# Patient Record
Sex: Male | Born: 1950 | Race: White | Hispanic: No | Marital: Married | Smoking: Never smoker
Health system: Southern US, Community
[De-identification: ages and names within clinical notes are randomized; demographics above are authoritative.]

## PROBLEM LIST (undated history)

## (undated) DIAGNOSIS — H5440 Blindness, one eye, unspecified eye: Secondary | ICD-10-CM

## (undated) DIAGNOSIS — Z87442 Personal history of urinary calculi: Secondary | ICD-10-CM

## (undated) DIAGNOSIS — H409 Unspecified glaucoma: Secondary | ICD-10-CM

## (undated) DIAGNOSIS — C801 Malignant (primary) neoplasm, unspecified: Secondary | ICD-10-CM

## (undated) DIAGNOSIS — R202 Paresthesia of skin: Secondary | ICD-10-CM

## (undated) DIAGNOSIS — I10 Essential (primary) hypertension: Secondary | ICD-10-CM

## (undated) DIAGNOSIS — Z8709 Personal history of other diseases of the respiratory system: Secondary | ICD-10-CM

## (undated) DIAGNOSIS — J40 Bronchitis, not specified as acute or chronic: Secondary | ICD-10-CM

## (undated) DIAGNOSIS — R200 Anesthesia of skin: Secondary | ICD-10-CM

## (undated) DIAGNOSIS — T8859XA Other complications of anesthesia, initial encounter: Secondary | ICD-10-CM

## (undated) HISTORY — PX: EYE SURGERY: SHX253

## (undated) HISTORY — PX: ANKLE SURGERY: SHX546

## (undated) HISTORY — DX: Malignant (primary) neoplasm, unspecified: C80.1

## (undated) HISTORY — PX: COLONOSCOPY: SHX174

---

## 2007-12-26 ENCOUNTER — Ambulatory Visit: Payer: Self-pay | Admitting: General Surgery

## 2009-04-17 ENCOUNTER — Ambulatory Visit: Payer: Self-pay | Admitting: Podiatry

## 2009-04-18 ENCOUNTER — Ambulatory Visit: Payer: Self-pay | Admitting: Podiatry

## 2009-04-21 ENCOUNTER — Emergency Department: Payer: Self-pay | Admitting: Emergency Medicine

## 2009-09-24 ENCOUNTER — Ambulatory Visit: Payer: Self-pay | Admitting: Podiatry

## 2010-01-12 ENCOUNTER — Ambulatory Visit: Payer: Self-pay | Admitting: Ophthalmology

## 2015-12-09 ENCOUNTER — Other Ambulatory Visit: Payer: Self-pay | Admitting: Student

## 2015-12-09 DIAGNOSIS — M7582 Other shoulder lesions, left shoulder: Secondary | ICD-10-CM

## 2015-12-26 ENCOUNTER — Ambulatory Visit
Admission: RE | Admit: 2015-12-26 | Discharge: 2015-12-26 | Disposition: A | Discharge status: Home / Self Care | Payer: Federal, State, Local not specified - PPO | Source: Ambulatory Visit | Attending: Student | Admitting: Student

## 2015-12-26 DIAGNOSIS — X58XXXA Exposure to other specified factors, initial encounter: Secondary | ICD-10-CM | POA: Diagnosis not present

## 2015-12-26 DIAGNOSIS — S43432A Superior glenoid labrum lesion of left shoulder, initial encounter: Secondary | ICD-10-CM | POA: Insufficient documentation

## 2015-12-26 DIAGNOSIS — M7582 Other shoulder lesions, left shoulder: Secondary | ICD-10-CM | POA: Diagnosis present

## 2015-12-31 DIAGNOSIS — M7582 Other shoulder lesions, left shoulder: Secondary | ICD-10-CM | POA: Insufficient documentation

## 2015-12-31 DIAGNOSIS — S43432A Superior glenoid labrum lesion of left shoulder, initial encounter: Secondary | ICD-10-CM | POA: Insufficient documentation

## 2016-01-20 ENCOUNTER — Other Ambulatory Visit: Payer: Self-pay | Admitting: Orthopaedic Surgery

## 2016-01-20 DIAGNOSIS — M4722 Other spondylosis with radiculopathy, cervical region: Secondary | ICD-10-CM

## 2016-01-31 ENCOUNTER — Ambulatory Visit (HOSPITAL_COMMUNITY)
Admission: RE | Admit: 2016-01-31 | Discharge: 2016-01-31 | Disposition: A | Discharge status: Home / Self Care | Payer: Federal, State, Local not specified - PPO | Source: Ambulatory Visit | Attending: Orthopaedic Surgery | Admitting: Orthopaedic Surgery

## 2016-01-31 DIAGNOSIS — M50121 Cervical disc disorder at C4-C5 level with radiculopathy: Secondary | ICD-10-CM | POA: Insufficient documentation

## 2016-01-31 DIAGNOSIS — M50122 Cervical disc disorder at C5-C6 level with radiculopathy: Secondary | ICD-10-CM | POA: Diagnosis not present

## 2016-01-31 DIAGNOSIS — M50123 Cervical disc disorder at C6-C7 level with radiculopathy: Secondary | ICD-10-CM | POA: Insufficient documentation

## 2016-01-31 DIAGNOSIS — M5021 Other cervical disc displacement,  high cervical region: Secondary | ICD-10-CM | POA: Insufficient documentation

## 2016-01-31 DIAGNOSIS — M4802 Spinal stenosis, cervical region: Secondary | ICD-10-CM | POA: Diagnosis not present

## 2016-01-31 DIAGNOSIS — M50221 Other cervical disc displacement at C4-C5 level: Secondary | ICD-10-CM | POA: Diagnosis not present

## 2016-01-31 DIAGNOSIS — M4722 Other spondylosis with radiculopathy, cervical region: Secondary | ICD-10-CM | POA: Insufficient documentation

## 2016-02-11 ENCOUNTER — Ambulatory Visit: Payer: Federal, State, Local not specified - PPO

## 2016-02-18 ENCOUNTER — Other Ambulatory Visit: Payer: Self-pay | Admitting: Orthopedic Surgery

## 2016-03-08 NOTE — Pre-Procedure Instructions (Signed)
Manuel Patel  03/08/2016      [facility] [address] [place], [place] - [address] [facility] [address] [facility] [address] [facility] [address] [place] [address] Phone: [phone] Fax: [phone]    Your procedure is scheduled on Wednesday, August 9th, 2017.  Report to [facility] Admitting at 11:30 A.M.   Call this number if you have problems the morning of surgery:  [phone]   Remember:  Do not eat food or drink liquids after midnight.   Take these medicines the morning of surgery with A SIP OF WATER: Atenolol (Tenormin).  7 days prior to surgery, stop taking: Aspirin, NSAIDS, Aleve, Naproxen, Ibuprofen, Advil, Motrin, BC's, Goody's, Fish oil, all herbal medications, and all vitamins.    Do not wear jewelry.  Do not wear lotions, powders, or colognes.    Men may shave face and neck.  Do not bring valuables to the hospital.  [facility] is not responsible for any belongings or valuables.  Contacts, dentures or bridgework may not be worn into surgery.  Leave your suitcase in the car.  After surgery it may be brought to your room.  For patients admitted to the hospital, discharge time will be determined by your treatment team.  Patients discharged the day of surgery will not be allowed to drive home.   Special instructions:  Preparing for Surgery.   Please read over the following fact sheets that you were given. MRSA Information    [place]- Preparing For Surgery  Before surgery, you can play an important role. Because skin is not sterile, your skin needs to be as free of germs as possible. You can reduce the number of germs on your skin by washing with CHG (chlorahexidine gluconate) Soap before surgery.  CHG is an antiseptic cleaner which kills germs and bonds with the skin to continue killing germs even after washing.  Please do not use if you have an allergy to CHG or antibacterial soaps. If your skin becomes reddened/irritated stop using the CHG.  Do not  shave (including legs and underarms) for at least 48 hours prior to first CHG shower. It is OK to shave your face.  Please follow these instructions carefully.   1. Shower the NIGHT BEFORE SURGERY and the MORNING OF SURGERY with CHG.   2. If you chose to wash your hair, wash your hair first as usual with your normal shampoo.  3. After you shampoo, rinse your hair and body thoroughly to remove the shampoo.  4. Use CHG as you would any other liquid soap. You can apply CHG directly to the skin and wash gently with a scrungie or a clean washcloth.   5. Apply the CHG Soap to your body ONLY FROM THE NECK DOWN.  Do not use on open wounds or open sores. Avoid contact with your eyes, ears, mouth and genitals (private parts). Wash genitals (private parts) with your normal soap.  6. Wash thoroughly, paying special attention to the area where your surgery will be performed.  7. Thoroughly rinse your body with warm water from the neck down.  8. DO NOT shower/wash with your normal soap after using and rinsing off the CHG Soap.  9. Pat yourself dry with a CLEAN TOWEL.   10. Wear CLEAN PAJAMAS   11. Place CLEAN SHEETS on your bed the night of your first shower and DO NOT SLEEP WITH PETS.  Day of Surgery: Do not apply any deodorants/lotions. Please wear clean clothes to  the hospital/surgery center.

## 2016-03-09 ENCOUNTER — Encounter (HOSPITAL_COMMUNITY): Payer: Self-pay

## 2016-03-09 ENCOUNTER — Encounter (HOSPITAL_COMMUNITY)
Admission: RE | Admit: 2016-03-09 | Discharge: 2016-03-09 | Disposition: A | Discharge status: Home / Self Care | Payer: Federal, State, Local not specified - PPO | Source: Ambulatory Visit | Attending: Orthopedic Surgery | Admitting: Orthopedic Surgery

## 2016-03-09 ENCOUNTER — Ambulatory Visit (HOSPITAL_COMMUNITY)
Admission: RE | Admit: 2016-03-09 | Discharge: 2016-03-09 | Disposition: A | Discharge status: Home / Self Care | Payer: Federal, State, Local not specified - PPO | Source: Ambulatory Visit | Attending: Orthopedic Surgery | Admitting: Orthopedic Surgery

## 2016-03-09 DIAGNOSIS — I878 Other specified disorders of veins: Secondary | ICD-10-CM | POA: Diagnosis not present

## 2016-03-09 DIAGNOSIS — Z01818 Encounter for other preprocedural examination: Secondary | ICD-10-CM | POA: Diagnosis present

## 2016-03-09 DIAGNOSIS — R001 Bradycardia, unspecified: Secondary | ICD-10-CM | POA: Insufficient documentation

## 2016-03-09 DIAGNOSIS — Z01810 Encounter for preprocedural cardiovascular examination: Secondary | ICD-10-CM | POA: Diagnosis not present

## 2016-03-09 DIAGNOSIS — I517 Cardiomegaly: Secondary | ICD-10-CM | POA: Insufficient documentation

## 2016-03-09 HISTORY — DX: Personal history of urinary calculi: Z87.442

## 2016-03-09 HISTORY — DX: Essential (primary) hypertension: I10

## 2016-03-09 HISTORY — DX: Anesthesia of skin: R20.0

## 2016-03-09 HISTORY — DX: Personal history of other diseases of the respiratory system: Z87.09

## 2016-03-09 HISTORY — DX: Blindness, one eye, unspecified eye: H54.40

## 2016-03-09 HISTORY — DX: Unspecified glaucoma: H40.9

## 2016-03-09 HISTORY — DX: Paresthesia of skin: R20.2

## 2016-03-09 LAB — CBC WITH DIFFERENTIAL/PLATELET
BASOS PCT: 0 %
Basophils Absolute: 0 10*3/uL (ref 0.0–0.1)
EOS ABS: 0.4 10*3/uL (ref 0.0–0.7)
Eosinophils Relative: 4 %
HCT: 44.8 % (ref 39.0–52.0)
Hemoglobin: 15.4 g/dL (ref 13.0–17.0)
Lymphocytes Relative: 33 %
Lymphs Abs: 2.9 10*3/uL (ref 0.7–4.0)
MCH: 29.9 pg (ref 26.0–34.0)
MCHC: 34.4 g/dL (ref 30.0–36.0)
MCV: 87 fL (ref 78.0–100.0)
MONOS PCT: 9 %
Monocytes Absolute: 0.8 10*3/uL (ref 0.1–1.0)
NEUTROS PCT: 54 %
Neutro Abs: 4.8 10*3/uL (ref 1.7–7.7)
PLATELETS: 249 10*3/uL (ref 150–400)
RBC: 5.15 MIL/uL (ref 4.22–5.81)
RDW: 13.5 % (ref 11.5–15.5)
WBC: 8.9 10*3/uL (ref 4.0–10.5)

## 2016-03-09 LAB — URINALYSIS, ROUTINE W REFLEX MICROSCOPIC
Bilirubin Urine: NEGATIVE
GLUCOSE, UA: NEGATIVE mg/dL
Hgb urine dipstick: NEGATIVE
Ketones, ur: NEGATIVE mg/dL
LEUKOCYTES UA: NEGATIVE
NITRITE: NEGATIVE
PROTEIN: NEGATIVE mg/dL
Specific Gravity, Urine: 1.013 (ref 1.005–1.030)
pH: 6.5 (ref 5.0–8.0)

## 2016-03-09 LAB — SURGICAL PCR SCREEN
MRSA, PCR: NEGATIVE
STAPHYLOCOCCUS AUREUS: NEGATIVE

## 2016-03-09 LAB — PROTIME-INR
INR: 1.03
Prothrombin Time: 13.6 seconds (ref 11.4–15.2)

## 2016-03-09 LAB — APTT: aPTT: 32 seconds (ref 24–36)

## 2016-03-09 LAB — COMPREHENSIVE METABOLIC PANEL
ALT: 22 U/L (ref 17–63)
AST: 30 U/L (ref 15–41)
Albumin: 4.3 g/dL (ref 3.5–5.0)
Alkaline Phosphatase: 88 U/L (ref 38–126)
Anion gap: 5 (ref 5–15)
BUN: 11 mg/dL (ref 6–20)
CHLORIDE: 108 mmol/L (ref 101–111)
CO2: 25 mmol/L (ref 22–32)
CREATININE: 0.87 mg/dL (ref 0.61–1.24)
Calcium: 9.5 mg/dL (ref 8.9–10.3)
GFR calc non Af Amer: >60 mL/min (ref >60)
Glucose, Bld: 93 mg/dL (ref 65–99)
POTASSIUM: 4.2 mmol/L (ref 3.5–5.1)
SODIUM: 138 mmol/L (ref 135–145)
Total Bilirubin: 1.2 mg/dL (ref 0.3–1.2)
Total Protein: 6.9 g/dL (ref 6.5–8.1)

## 2016-03-09 NOTE — Progress Notes (Signed)
PCP - Dr. Benita Stabile Cardiologist - denies  EKG - 03/09/16 CXR - 03/09/16  Echo/Stress Test/Cardiac Cath - denies  Patient denies chest pain and shortness of breath at PAT appointment.

## 2016-03-12 NOTE — H&P (Signed)
     PREOPERATIVE H&P  Chief Complaint: left shoulder weakness, left arm pain  HPI: Manuel Patel is a 65 y.o. male who presents with ongoing pain in the in the left arm and weakness in the left shoulder.  The patient has been having symptoms for 7 months at this point. The patient's EMG does reveal left C5 radiculopathy.  MRI reveals neuroforaminal stenosis on the left, spanning C3-C6. Spinal cord deformity is also noted at C3-C4 and C4-C5.  Myelomalacia is noted at C3-C4.  Patient has failed multiple forms of conservative care and continues to have pain (see office notes for additional details regarding the patient's full course of treatment)  Past Medical History:  Diagnosis Date  . Blind left eye   . Glaucoma, right eye   . History of bronchitis   . History of kidney stones   . Hypertension   . Numbness and tingling in left arm    Past Surgical History:  Procedure Laterality Date  . ANKLE SURGERY Right   . COLONOSCOPY    . EYE SURGERY Left    prosthetic left eye  . EYE SURGERY Right    cataract   Social History   Social History  . Marital status: Married    Spouse name: N/A  . Number of children: N/A  . Years of education: N/A   Social History Main Topics  . Smoking status: Never Smoker  . Smokeless tobacco: Never Used  . Alcohol use No  . Drug use: No  . Sexual activity: Not on file   Other Topics Concern  . Not on file   Social History Narrative  . No narrative on file   No family history on file. Allergies  Allergen Reactions  . Penicillin V Rash   Prior to Admission medications   Medication Sig Start Date End Date Taking? Authorizing Provider  amLODipine-valsartan (EXFORGE) 5-160 MG tablet Take 1 tablet by mouth daily. 01/29/16  Yes Historical Provider, MD  Ascorbic Acid (VITAMIN C) 100 MG tablet Take 100 mg by mouth daily.   Yes Historical Provider, MD  atenolol (TENORMIN) 50 MG tablet Take 50 mg by mouth 2 (two) times daily. 12/15/15  Yes Historical  Provider, MD  cholecalciferol (VITAMIN D) 1000 units tablet Take 1,000 Units by mouth daily.   Yes Historical Provider, MD  Multiple Vitamin (MULTIVITAMIN) tablet Take 1 tablet by mouth daily.   Yes Historical Provider, MD  timolol (TIMOPTIC-XR) 0.5 % ophthalmic gel-forming Place 1 drop into the right eye at bedtime. 01/12/16  Yes Historical Provider, MD     All other systems have been reviewed and were otherwise negative with the exception of those mentioned in the HPI and as above.  Physical Exam: There were no vitals filed for this visit.  General: Alert, no acute distress Cardiovascular: No pedal edema Respiratory: No cyanosis, no use of accessory musculature Skin: No lesions in the area of chief complaint Neurologic: Sensation intact distally Psychiatric: Patient is competent for consent with normal mood and affect Lymphatic: No axillary or cervical lymphadenopathy  MUSCULOSKELETAL: 1/5 left deltoid strength   Assessment/Plan: Left arm pain Plan for Procedure(s): ANTERIOR CERVICAL DECOMPRESSION FUSION CERVICAL 3-4, CERVICAL 4-5, CERVICAL 5-6 WITH INSTRUMENTATION AND ALLOGRAFT   Sinclair Ship, MD 03/12/2016 12:55 PM

## 2016-03-16 MED ORDER — CEFAZOLIN SODIUM-DEXTROSE 2-4 GM/100ML-% IV SOLN
2.0000 g | INTRAVENOUS | Status: DC
  Filled 2016-03-16: qty 100

## 2016-03-17 ENCOUNTER — Encounter (HOSPITAL_COMMUNITY)
Admission: RE | Disposition: A | Discharge status: Home / Self Care | Payer: Self-pay | Source: Ambulatory Visit | Attending: Orthopedic Surgery

## 2016-03-17 ENCOUNTER — Ambulatory Visit (HOSPITAL_COMMUNITY): Payer: Federal, State, Local not specified - PPO | Admitting: Certified Registered"

## 2016-03-17 ENCOUNTER — Ambulatory Visit (HOSPITAL_COMMUNITY): Payer: Federal, State, Local not specified - PPO

## 2016-03-17 ENCOUNTER — Encounter (HOSPITAL_COMMUNITY): Payer: Self-pay | Admitting: *Deleted

## 2016-03-17 ENCOUNTER — Observation Stay (HOSPITAL_COMMUNITY)
Admission: RE | Admit: 2016-03-17 | Discharge: 2016-03-18 | Disposition: A | Discharge status: Home / Self Care | Payer: Federal, State, Local not specified - PPO | Source: Ambulatory Visit | Attending: Orthopedic Surgery | Admitting: Orthopedic Surgery

## 2016-03-17 DIAGNOSIS — Z880 Allergy status to penicillin: Secondary | ICD-10-CM | POA: Insufficient documentation

## 2016-03-17 DIAGNOSIS — M5410 Radiculopathy, site unspecified: Secondary | ICD-10-CM | POA: Diagnosis present

## 2016-03-17 DIAGNOSIS — H409 Unspecified glaucoma: Secondary | ICD-10-CM | POA: Insufficient documentation

## 2016-03-17 DIAGNOSIS — H5442 Blindness, left eye, normal vision right eye: Secondary | ICD-10-CM | POA: Diagnosis not present

## 2016-03-17 DIAGNOSIS — M50122 Cervical disc disorder at C5-C6 level with radiculopathy: Secondary | ICD-10-CM | POA: Diagnosis present

## 2016-03-17 DIAGNOSIS — I10 Essential (primary) hypertension: Secondary | ICD-10-CM | POA: Insufficient documentation

## 2016-03-17 DIAGNOSIS — Z419 Encounter for procedure for purposes other than remedying health state, unspecified: Secondary | ICD-10-CM

## 2016-03-17 HISTORY — PX: ANTERIOR CERVICAL DECOMP/DISCECTOMY FUSION: SHX1161

## 2016-03-17 SURGERY — ANTERIOR CERVICAL DECOMPRESSION/DISCECTOMY FUSION 3 LEVELS
Anesthesia: General

## 2016-03-17 MED ORDER — PROPOFOL 10 MG/ML IV BOLUS
INTRAVENOUS | Status: AC
  Filled 2016-03-17: qty 20

## 2016-03-17 MED ORDER — IRBESARTAN 150 MG PO TABS
150.0000 mg | ORAL_TABLET | Freq: Every day | ORAL | Status: DC
  Filled 2016-03-17 (×2): qty 1

## 2016-03-17 MED ORDER — FENTANYL CITRATE (PF) 100 MCG/2ML IJ SOLN: 25.0000 ug | INTRAMUSCULAR | Status: DC | PRN

## 2016-03-17 MED ORDER — ACETAMINOPHEN 650 MG RE SUPP
650.0000 mg | RECTAL | Status: DC | PRN
Start: 2016-03-17 — End: 2016-03-18

## 2016-03-17 MED ORDER — DOCUSATE SODIUM 100 MG PO CAPS: 100.0000 mg | ORAL_CAPSULE | Freq: Two times a day (BID) | ORAL | Status: DC

## 2016-03-17 MED ORDER — SENNOSIDES-DOCUSATE SODIUM 8.6-50 MG PO TABS
1.0000 | ORAL_TABLET | Freq: Every evening | ORAL | Status: DC | PRN
Start: 2016-03-17 — End: 2016-03-18

## 2016-03-17 MED ORDER — HYDROMORPHONE HCL 1 MG/ML IJ SOLN
INTRAMUSCULAR | Status: AC
Start: 2016-03-17 — End: 2016-03-17
  Filled 2016-03-17: qty 1

## 2016-03-17 MED ORDER — SODIUM CHLORIDE 0.9 % IV SOLN: 250.0000 mL | INTRAVENOUS | Status: DC

## 2016-03-17 MED ORDER — SUGAMMADEX SODIUM 200 MG/2ML IV SOLN
INTRAVENOUS | Status: AC
  Filled 2016-03-17: qty 2

## 2016-03-17 MED ORDER — 0.9 % SODIUM CHLORIDE (POUR BTL) OPTIME
TOPICAL | Status: DC | PRN
  Administered 2016-03-17: 1000 mL

## 2016-03-17 MED ORDER — METOCLOPRAMIDE HCL 5 MG/ML IJ SOLN
INTRAMUSCULAR | Status: DC | PRN
  Administered 2016-03-17: 10 mg via INTRAVENOUS

## 2016-03-17 MED ORDER — ACETAMINOPHEN 10 MG/ML IV SOLN
1000.0000 mg | INTRAVENOUS | Status: AC
  Administered 2016-03-17: 1000 mg via INTRAVENOUS
  Filled 2016-03-17: qty 100

## 2016-03-17 MED ORDER — EPHEDRINE 5 MG/ML INJ
INTRAVENOUS | Status: AC
  Filled 2016-03-17: qty 10

## 2016-03-17 MED ORDER — AMLODIPINE BESYLATE 5 MG PO TABS
5.0000 mg | ORAL_TABLET | Freq: Every day | ORAL | Status: DC
  Filled 2016-03-17 (×2): qty 1

## 2016-03-17 MED ORDER — METOCLOPRAMIDE HCL 5 MG/ML IJ SOLN
INTRAMUSCULAR | Status: AC
  Filled 2016-03-17: qty 2

## 2016-03-17 MED ORDER — PROPOFOL 10 MG/ML IV BOLUS
INTRAVENOUS | Status: DC | PRN
  Administered 2016-03-17: 160 mg via INTRAVENOUS

## 2016-03-17 MED ORDER — VITAMIN C 100 MG PO TABS: 100.0000 mg | ORAL_TABLET | Freq: Every day | ORAL | Status: DC

## 2016-03-17 MED ORDER — ONDANSETRON HCL 4 MG/2ML IJ SOLN
INTRAMUSCULAR | Status: AC
  Filled 2016-03-17: qty 2

## 2016-03-17 MED ORDER — ONDANSETRON HCL 4 MG/2ML IJ SOLN
INTRAMUSCULAR | Status: DC | PRN
  Administered 2016-03-17: 4 mg via INTRAVENOUS

## 2016-03-17 MED ORDER — VANCOMYCIN HCL IN DEXTROSE 1-5 GM/200ML-% IV SOLN
1000.0000 mg | INTRAVENOUS | Status: AC
  Administered 2016-03-17: 1000 mg via INTRAVENOUS
  Filled 2016-03-17: qty 200

## 2016-03-17 MED ORDER — ONDANSETRON HCL 4 MG/2ML IJ SOLN: 4.0000 mg | INTRAMUSCULAR | Status: DC | PRN

## 2016-03-17 MED ORDER — DIAZEPAM 5 MG PO TABS
5.0000 mg | ORAL_TABLET | Freq: Four times a day (QID) | ORAL | Status: DC | PRN
Start: 2016-03-17 — End: 2016-03-18
  Administered 2016-03-17: 5 mg via ORAL
  Filled 2016-03-17: qty 1

## 2016-03-17 MED ORDER — MORPHINE SULFATE (PF) 2 MG/ML IV SOLN
1.0000 mg | INTRAVENOUS | Status: DC | PRN
  Administered 2016-03-17 (×2): 2 mg via INTRAVENOUS
  Filled 2016-03-17 (×2): qty 1

## 2016-03-17 MED ORDER — OXYCODONE-ACETAMINOPHEN 5-325 MG PO TABS
1.0000 | ORAL_TABLET | ORAL | Status: DC | PRN
  Filled 2016-03-17: qty 2

## 2016-03-17 MED ORDER — SUGAMMADEX SODIUM 200 MG/2ML IV SOLN
INTRAVENOUS | Status: DC | PRN
  Administered 2016-03-17: 192.4 mg via INTRAVENOUS

## 2016-03-17 MED ORDER — THROMBIN 20000 UNITS EX SOLR
CUTANEOUS | Status: AC
  Filled 2016-03-17: qty 20000

## 2016-03-17 MED ORDER — MENTHOL 3 MG MT LOZG: 1.0000 | LOZENGE | OROMUCOSAL | Status: DC | PRN

## 2016-03-17 MED ORDER — MIDAZOLAM HCL 5 MG/5ML IJ SOLN
INTRAMUSCULAR | Status: DC | PRN
  Administered 2016-03-17 (×2): 2 mg via INTRAVENOUS

## 2016-03-17 MED ORDER — BISACODYL 5 MG PO TBEC: 5.0000 mg | DELAYED_RELEASE_TABLET | Freq: Every day | ORAL | Status: DC | PRN

## 2016-03-17 MED ORDER — TIMOLOL MALEATE 0.5 % OP SOLG
1.0000 [drp] | Freq: Every day | OPHTHALMIC | Status: DC
  Administered 2016-03-17: 1 [drp] via OPHTHALMIC
  Filled 2016-03-17 (×9): qty 5

## 2016-03-17 MED ORDER — BUPIVACAINE-EPINEPHRINE 0.25% -1:200000 IJ SOLN
INTRAMUSCULAR | Status: DC | PRN
  Administered 2016-03-17: 5 mL

## 2016-03-17 MED ORDER — ROCURONIUM BROMIDE 10 MG/ML (PF) SYRINGE
PREFILLED_SYRINGE | INTRAVENOUS | Status: AC
  Filled 2016-03-17: qty 10

## 2016-03-17 MED ORDER — LACTATED RINGERS IV SOLN
INTRAVENOUS | Status: DC
  Administered 2016-03-17 (×3): via INTRAVENOUS

## 2016-03-17 MED ORDER — ROCURONIUM BROMIDE 100 MG/10ML IV SOLN
INTRAVENOUS | Status: DC | PRN
  Administered 2016-03-17: 60 mg via INTRAVENOUS
  Administered 2016-03-17 (×2): 10 mg via INTRAVENOUS

## 2016-03-17 MED ORDER — MIDAZOLAM HCL 2 MG/2ML IJ SOLN
INTRAMUSCULAR | Status: AC
  Filled 2016-03-17: qty 2

## 2016-03-17 MED ORDER — ADULT MULTIVITAMIN W/MINERALS CH
1.0000 | ORAL_TABLET | Freq: Every day | ORAL | Status: DC
Start: 2016-03-17 — End: 2016-03-18

## 2016-03-17 MED ORDER — EPHEDRINE SULFATE 50 MG/ML IJ SOLN
INTRAMUSCULAR | Status: DC | PRN
  Administered 2016-03-17 (×2): 10 mg via INTRAVENOUS
  Administered 2016-03-17: 5 mg via INTRAVENOUS
  Administered 2016-03-17 (×4): 10 mg via INTRAVENOUS

## 2016-03-17 MED ORDER — THROMBIN 20000 UNITS EX KIT
PACK | CUTANEOUS | Status: DC | PRN
  Administered 2016-03-17: 20000 [IU] via TOPICAL

## 2016-03-17 MED ORDER — AMLODIPINE BESYLATE-VALSARTAN 5-160 MG PO TABS: 1.0000 | ORAL_TABLET | Freq: Every day | ORAL | Status: DC

## 2016-03-17 MED ORDER — BUPIVACAINE-EPINEPHRINE (PF) 0.25% -1:200000 IJ SOLN
INTRAMUSCULAR | Status: AC
  Filled 2016-03-17: qty 30

## 2016-03-17 MED ORDER — ALUM & MAG HYDROXIDE-SIMETH 200-200-20 MG/5ML PO SUSP: 30.0000 mL | Freq: Four times a day (QID) | ORAL | Status: DC | PRN

## 2016-03-17 MED ORDER — HYDROMORPHONE HCL 1 MG/ML IJ SOLN
INTRAMUSCULAR | Status: DC | PRN
  Administered 2016-03-17: .25 mg via INTRAVENOUS

## 2016-03-17 MED ORDER — DIPHENHYDRAMINE HCL 50 MG/ML IJ SOLN
INTRAMUSCULAR | Status: AC
  Filled 2016-03-17: qty 1

## 2016-03-17 MED ORDER — FLEET ENEMA 7-19 GM/118ML RE ENEM: 1.0000 | ENEMA | Freq: Once | RECTAL | Status: DC | PRN

## 2016-03-17 MED ORDER — GLYCOPYRROLATE 0.2 MG/ML IV SOSY
PREFILLED_SYRINGE | INTRAVENOUS | Status: AC
  Filled 2016-03-17: qty 3

## 2016-03-17 MED ORDER — PHENOL 1.4 % MT LIQD
1.0000 | OROMUCOSAL | Status: DC | PRN
Start: 2016-03-17 — End: 2016-03-18
  Administered 2016-03-17: 1 via OROMUCOSAL
  Filled 2016-03-17: qty 177

## 2016-03-17 MED ORDER — LIDOCAINE 2% (20 MG/ML) 5 ML SYRINGE
INTRAMUSCULAR | Status: AC
  Filled 2016-03-17: qty 5

## 2016-03-17 MED ORDER — SODIUM CHLORIDE 0.9% FLUSH: 3.0000 mL | INTRAVENOUS | Status: DC | PRN

## 2016-03-17 MED ORDER — ZOLPIDEM TARTRATE 5 MG PO TABS: 5.0000 mg | ORAL_TABLET | Freq: Every evening | ORAL | Status: DC | PRN

## 2016-03-17 MED ORDER — ACETAMINOPHEN 325 MG PO TABS: 650.0000 mg | ORAL_TABLET | ORAL | Status: DC | PRN

## 2016-03-17 MED ORDER — FENTANYL CITRATE (PF) 100 MCG/2ML IJ SOLN
INTRAMUSCULAR | Status: DC | PRN
Start: 2016-03-17 — End: 2016-03-17
  Administered 2016-03-17: 250 ug via INTRAVENOUS

## 2016-03-17 MED ORDER — DIPHENHYDRAMINE HCL 50 MG/ML IJ SOLN
INTRAMUSCULAR | Status: DC | PRN
  Administered 2016-03-17: 12.5 mg via INTRAVENOUS

## 2016-03-17 MED ORDER — VANCOMYCIN HCL IN DEXTROSE 1-5 GM/200ML-% IV SOLN
1000.0000 mg | Freq: Once | INTRAVENOUS | Status: AC
  Administered 2016-03-17: 1000 mg via INTRAVENOUS

## 2016-03-17 MED ORDER — GLYCOPYRROLATE 0.2 MG/ML IJ SOLN
INTRAMUSCULAR | Status: DC | PRN
  Administered 2016-03-17 (×2): 0.2 mg via INTRAVENOUS
  Administered 2016-03-17: .2 mg via INTRAVENOUS

## 2016-03-17 MED ORDER — ATENOLOL 50 MG PO TABS
50.0000 mg | ORAL_TABLET | Freq: Two times a day (BID) | ORAL | Status: DC
Start: 2016-03-17 — End: 2016-03-18
  Administered 2016-03-17: 50 mg via ORAL
  Filled 2016-03-17 (×2): qty 1

## 2016-03-17 MED ORDER — FENTANYL CITRATE (PF) 250 MCG/5ML IJ SOLN
INTRAMUSCULAR | Status: AC
  Filled 2016-03-17: qty 5

## 2016-03-17 MED ORDER — SODIUM CHLORIDE 0.9% FLUSH
3.0000 mL | Freq: Two times a day (BID) | INTRAVENOUS | Status: DC
  Administered 2016-03-17: 3 mL via INTRAVENOUS

## 2016-03-17 MED ORDER — VITAMIN D 1000 UNITS PO TABS: 1000.0000 [IU] | ORAL_TABLET | Freq: Every day | ORAL | Status: DC

## 2016-03-17 MED ORDER — TIMOLOL MALEATE 0.5 % OP SOLG
1.0000 [drp] | Freq: Every day | OPHTHALMIC | Status: DC
Start: 2016-03-17 — End: 2016-03-17

## 2016-03-17 MED ORDER — POVIDONE-IODINE 7.5 % EX SOLN
Freq: Once | CUTANEOUS | Status: DC
  Filled 2016-03-17: qty 118

## 2016-03-17 SURGICAL SUPPLY — 71 items
BENZOIN TINCTURE PRP APPL 2/3 (GAUZE/BANDAGES/DRESSINGS) ×2 IMPLANT
BIT DRILL NEURO 2X3.1 SFT TUCH (MISCELLANEOUS) ×1 IMPLANT
BIT DRILL SRG 14X2.2XFLT CHK (BIT) ×1 IMPLANT
BIT DRL SRG 14X2.2XFLT CHK (BIT) ×1
BLADE SURG 15 STRL LF DISP TIS (BLADE) ×1 IMPLANT
BLADE SURG 15 STRL SS (BLADE) ×1
BLADE SURG ROTATE 9660 (MISCELLANEOUS) ×2 IMPLANT
CANISTER SUCT 3000ML (MISCELLANEOUS) ×2 IMPLANT
CARTRIDGE OIL MAESTRO DRILL (MISCELLANEOUS) ×1 IMPLANT
COVER SURGICAL LIGHT HANDLE (MISCELLANEOUS) ×2 IMPLANT
CRADLE DONUT ADULT HEAD (MISCELLANEOUS) ×2 IMPLANT
DIFFUSER DRILL AIR PNEUMATIC (MISCELLANEOUS) ×2 IMPLANT
DRAIN JACKSON RD 7FR 3/32 (WOUND CARE) IMPLANT
DRAPE C-ARM 42X72 X-RAY (DRAPES) ×2 IMPLANT
DRAPE POUCH INSTRU U-SHP 10X18 (DRAPES) ×2 IMPLANT
DRAPE SURG 17X23 STRL (DRAPES) ×6 IMPLANT
DRILL BIT SKYLINE 14MM (BIT) ×1
DRILL NEURO 2X3.1 SOFT TOUCH (MISCELLANEOUS) ×2
DURAPREP 6ML APPLICATOR 50/CS (WOUND CARE) ×2 IMPLANT
ELECT COATED BLADE 2.86 ST (ELECTRODE) ×2 IMPLANT
ELECT REM PT RETURN 9FT ADLT (ELECTROSURGICAL) ×2
ELECTRODE REM PT RTRN 9FT ADLT (ELECTROSURGICAL) ×1 IMPLANT
EVACUATOR SILICONE 100CC (DRAIN) IMPLANT
GAUZE SPONGE 4X4 12PLY STRL (GAUZE/BANDAGES/DRESSINGS) ×2 IMPLANT
GAUZE SPONGE 4X4 16PLY XRAY LF (GAUZE/BANDAGES/DRESSINGS) ×2 IMPLANT
GLOVE BIO SURGEON STRL SZ7 (GLOVE) ×2 IMPLANT
GLOVE BIO SURGEON STRL SZ8 (GLOVE) ×2 IMPLANT
GLOVE BIOGEL PI IND STRL 7.0 (GLOVE) ×1 IMPLANT
GLOVE BIOGEL PI IND STRL 8 (GLOVE) ×1 IMPLANT
GLOVE BIOGEL PI INDICATOR 7.0 (GLOVE) ×1
GLOVE BIOGEL PI INDICATOR 8 (GLOVE) ×1
GOWN STRL REUS W/ TWL LRG LVL3 (GOWN DISPOSABLE) ×1 IMPLANT
GOWN STRL REUS W/ TWL XL LVL3 (GOWN DISPOSABLE) ×1 IMPLANT
GOWN STRL REUS W/TWL LRG LVL3 (GOWN DISPOSABLE) ×1
GOWN STRL REUS W/TWL XL LVL3 (GOWN DISPOSABLE) ×1
INTERLOCK LRDTC CRVCL VBR 7MM (Bone Implant) ×3 IMPLANT
IV CATH 14GX2 1/4 (CATHETERS) ×2 IMPLANT
KIT BASIN OR (CUSTOM PROCEDURE TRAY) ×2 IMPLANT
KIT ROOM TURNOVER OR (KITS) ×2 IMPLANT
LORDOTIC CERVICAL VBR 7MM SM (Bone Implant) ×6 IMPLANT
MANIFOLD NEPTUNE II (INSTRUMENTS) ×2 IMPLANT
NEEDLE 27GAX1X1/2 (NEEDLE) ×2 IMPLANT
NEEDLE SPNL 20GX3.5 QUINCKE YW (NEEDLE) ×2 IMPLANT
NS IRRIG 1000ML POUR BTL (IV SOLUTION) ×2 IMPLANT
OIL CARTRIDGE MAESTRO DRILL (MISCELLANEOUS) ×2
PACK ORTHO CERVICAL (CUSTOM PROCEDURE TRAY) ×2 IMPLANT
PAD ARMBOARD 7.5X6 YLW CONV (MISCELLANEOUS) ×2 IMPLANT
PATTIES SURGICAL .5 X.5 (GAUZE/BANDAGES/DRESSINGS) IMPLANT
PATTIES SURGICAL .5 X1 (DISPOSABLE) IMPLANT
PIN DISTRACTION 14 (PIN) ×4 IMPLANT
PLATE SKYLINE THREE LEVEL 51MM (Plate) ×2 IMPLANT
PUTTY BONE DBX 5CC MIX (Putty) ×2 IMPLANT
SCREW SKYLINE VAR OS 14MM (Screw) ×16 IMPLANT
SPONGE INTESTINAL PEANUT (DISPOSABLE) ×6 IMPLANT
SPONGE SURGIFOAM ABS GEL 100 (HEMOSTASIS) IMPLANT
SPONGE SURGIFOAM ABS GEL 100C (HEMOSTASIS) ×2 IMPLANT
STRIP CLOSURE SKIN 1/2X4 (GAUZE/BANDAGES/DRESSINGS) ×2 IMPLANT
SURGIFLO W/THROMBIN 8M KIT (HEMOSTASIS) IMPLANT
SUT MNCRL AB 4-0 PS2 18 (SUTURE) ×2 IMPLANT
SUT SILK 4 0 (SUTURE)
SUT SILK 4-0 18XBRD TIE 12 (SUTURE) IMPLANT
SUT VIC AB 2-0 CT2 18 VCP726D (SUTURE) ×2 IMPLANT
SYR BULB IRRIGATION 50ML (SYRINGE) ×2 IMPLANT
SYR CONTROL 10ML LL (SYRINGE) ×2 IMPLANT
TAPE CLOTH 4X10 WHT NS (GAUZE/BANDAGES/DRESSINGS) ×2 IMPLANT
TAPE UMBILICAL COTTON 1/8X30 (MISCELLANEOUS) ×2 IMPLANT
TOWEL OR 17X24 6PK STRL BLUE (TOWEL DISPOSABLE) ×2 IMPLANT
TOWEL OR 17X26 10 PK STRL BLUE (TOWEL DISPOSABLE) ×2 IMPLANT
TRAY FOLEY CATH 16FRSI W/METER (SET/KITS/TRAYS/PACK) ×2 IMPLANT
WATER STERILE IRR 1000ML POUR (IV SOLUTION) ×2 IMPLANT
YANKAUER SUCT BULB TIP NO VENT (SUCTIONS) ×2 IMPLANT

## 2016-03-17 NOTE — Anesthesia Procedure Notes (Addendum)
Procedure Name: Intubation Date/Time: 03/17/2016 12:21 PM Performed by: Huey Romans ANN Pre-anesthesia Checklist: Patient identified, Emergency Drugs available, Suction available and Patient being monitored Patient Re-evaluated:Patient Re-evaluated prior to inductionOxygen Delivery Method: Circle System Utilized Preoxygenation: Pre-oxygenation with 100% oxygen Intubation Type: IV induction Ventilation: Mask ventilation without difficulty Laryngoscope Size: Miller and 3 Grade View: Grade II Tube type: Oral Number of attempts: 1 Airway Equipment and Method: Stylet,  Oral airway and LTA kit utilized Placement Confirmation: ETT inserted through vocal cords under direct vision,  positive ETCO2 and breath sounds checked- equal and bilateral Secured at: 23 cm Tube secured with: Tape Dental Injury: Teeth and Oropharynx as per pre-operative assessment

## 2016-03-17 NOTE — Op Note (Signed)
Date of Surgery: 03/17/2016  PREOPERATIVE DIAGNOSES: 1. Left-sided cervical radiculopathy. 2. Left shoulder weakness 3. Neuroforaminal stenosis and SCC spanning C3-C6 4. DDD C3-C6  POSTOPERATIVE DIAGNOSES: 1. Left-sided cervical radiculopathy. 2. Left shoulder weakness 3. Neuroforaminal stenosis and SCC spanning C3-C6 4. DDD C3-C6  PROCEDURE: 1. Anterior cervical decompression and fusion C3-C6 2. Placement of anterior instrumentation, C3-C6 3. Insertion of interbody device x 3 (Titan intervertebral spacers). 4. Use of morselized allograft. 5. Intraoperative use of fluoroscopy.  SURGEON: Phylliss Bob, MD.  ASSISTANTLonn Georgia McKenzie PA-C.  ANESTHESIA: General endotracheal anesthesia.  COMPLICATIONS: None.  DISPOSITION: Stable.  ESTIMATED BLOOD LOSS: Minimal.  INDICATIONS FOR SURGERY: Briefly, Manuel Patel is a pleasant 66 year old male, who did present to me with a history of ongoing pain and weakness in the left arm. An MRI did reveal the findings noted above. In addition, myelomalacia was noted. Given the  patient's ongoing pain and weakness and the findings on his MRI and lack of improvement with  appropriate nonoperative measures, we did discuss proceeding with the procedure noted above. The patient was fully aware of the risks and limitations of the surgery, and did elect to proceed. He did understand that his shoulder weakness may not improve.  OPERATIVE DETAILS: On 03/17/2016, the patient was brought to surgery and general endotracheal anesthesia was administered. The patient was placed supine on the hospital bed. The patient's neck was gently extended. The patient's arms were secured to his sides. All bony prominences were padded. The neck was prepped and draped. A left-sided transverse incision was then made. The platysma was incised. A Alben Deeds approach was utilized. A self-retaining retractor was placed and the vertebral bodies to  be fused were subperiosteally exposed. Caspar pins were then placed into the C5 and C6 vertebral bodies and distraction was applied. I then proceeded with a thorough and complete C5/6 intervertebral diskectomy. I was very pleased with the decompression  that I was able to accomplish. An appropriate decompression was confirmed  using a nerve hook. The endplates were then prepared and the appropriate sized interbody spacer was packed with DBX mix and tamped into position in the usual fashion. The lower Caspar pin was removed and bone wax was then placed in its place. A new Caspar pin was placed into the C4 vertebral body and distraction was applied across the C4/5 intervertebral space. Once again, a thorough diskectomy was performed. There was no stenosis noted at the completion of the diskectomy, this was confirmed using a nerve hook bilaterally. The endplates were then prepared and the appropriate sized interbody spacer was packed with DBX mix and tamped into position in the usual fashion. The lower Caspar pin was removed and bone wax was placed in its place. A new Caspar pin was placed into the C3 vertebral body and again, distraction was applied across the C3/4 intervertebral space. Once again, a thorough and complete C3/4 intervertebral diskectomy and decompression was performed. The endplates were then prepared. The appropriate sized interbody spacer was then packed with DBX mix and tamped into position in the usual fashion. I was very pleased with the press-fit of the spacers. The Caspar pins were removed and bone wax was placed in their place. I then selected the appropriate sized anterior cervical plate, which was placed over the anterior spine. 14 mm variable angle screws were placed, 2 in each vertebral body from C3 to C6 for a total of 8 vertebral body screws. The screws were then locked to the plate using the Cam locking  mechanism. I was very pleased  with the purchase of each of the screws. I was very pleased with the final fluoroscopic images. The wound was then irrigated. All bleeding was controlled using bipolar electrocautery. The wound was then closed in layers. The platysma was closed using 2-0 Vicryl and the skin was closed using 3-0 Monocryl. Benzoin and Steri-Strips were then applied followed by sterile dressing. All instrument counts were correct at the termination of the procedure.  Of note, Manuel Patel was my assistant throughout surgery, and did aid in retraction, suctioning, and closure from start to finish.     Phylliss Bob, MD

## 2016-03-17 NOTE — Progress Notes (Signed)
Orthopedic Tech Progress Note Patient Details:  Manuel Patel 22-Nov-1950 [id number]  Ortho Devices Type of Ortho Device: [place] cervical collar Ortho Device/Splint Interventions: Criss Alvine 03/17/2016, 7:08 PM

## 2016-03-17 NOTE — Transfer of Care (Signed)
Immediate Anesthesia Transfer of Care Note  Patient: Manuel Patel  Procedure(s) Performed: Procedure(s) with comments: ANTERIOR CERVICAL DECOMPRESSION FUSION CERVICAL 3-4, CERVICAL 4-5, CERVICAL 5-6 WITH INSTRUMENTATION AND ALLOGRAFT (N/A) - ANTERIOR CERVICAL DECOMPRESSION FUSION CERVICAL 3-4, CERVICAL 4-5, CERVICAL 5-6 WITH INSTRUMENTATION AND ALLOGRAFT  Patient Location: PACU  Anesthesia Type:General  Level of Consciousness: awake  Airway & Oxygen Therapy: Patient Spontanous Breathing and Patient connected to nasal cannula oxygen  Post-op Assessment: Report given to RN and Post -op Vital signs reviewed and stable  Post vital signs: Reviewed and stable  Last Vitals:  Vitals:   03/17/16 1115 03/17/16 1547  BP: (!) 159/92   Pulse: (!) 54   Resp: 20   Temp: 36.6 C (P) 36.5 C    Last Pain: There were no vitals filed for this visit.    Patients Stated Pain Goal: 2 ([id number])  Complications: No apparent anesthesia complications

## 2016-03-17 NOTE — Anesthesia Preprocedure Evaluation (Addendum)
Anesthesia Evaluation  Patient identified by MRN, date of birth, ID band Patient awake    Reviewed: Allergy & Precautions, H&P , Patient's Chart, lab work & pertinent test results, reviewed documented beta blocker date and time   Airway Mallampati: II  TM Distance: >3 FB Neck ROM: full   Comment: Thinned front teeth... Discussed ental injury Dental no notable dental hx.    Pulmonary    Pulmonary exam normal breath sounds clear to auscultation       Cardiovascular hypertension, On Home Beta Blockers and On Medications  Rhythm:regular Rate:Normal     Neuro/Psych    GI/Hepatic   Endo/Other    Renal/GU      Musculoskeletal   Abdominal   Peds  Hematology   Anesthesia Other Findings HTN; No CAD sx, NSR  Reproductive/Obstetrics                            Anesthesia Physical Anesthesia Plan  ASA: II  Anesthesia Plan: General   Post-op Pain Management:    Induction: Intravenous  Airway Management Planned: Oral ETT  Additional Equipment:   Intra-op Plan:   Post-operative Plan: Extubation in OR  Informed Consent: I have reviewed the patients History and Physical, chart, labs and discussed the procedure including the risks, benefits and alternatives for the proposed anesthesia with the patient or authorized representative who has indicated his/her understanding and acceptance.   Dental Advisory Given and Dental advisory given  Plan Discussed with: CRNA and Surgeon  Anesthesia Plan Comments: (  Discussed general anesthesia, including possible nausea, instrumentation of airway, sore throat,pulmonary aspiration, etc. I asked if the were any outstanding questions, or  concerns before we proceeded. )        Anesthesia Quick Evaluation

## 2016-03-17 NOTE — Progress Notes (Signed)
Pharmacy Antibiotic Note  Manuel Patel is a 65 y.o. male admitted on 03/17/2016 with surgical prophylaxis.  Pharmacy has been consulted for vancomycin dosing.  Plan: Vancomycin 1000mg  IV x1 dose 12 hours post pre-op dose.  No further doses needed as patient does NOT have drain in place.  Pharmacy will sign off - please re-consult if needed.   Weight: 212 lb (96.2 kg)  Temp (24hrs), Avg:97.7 F (36.5 C), Min:97.5 F (36.4 C), Max:97.9 F (36.6 C)  No results for input(s): WBC, CREATININE, LATICACIDVEN, VANCOTROUGH, VANCOPEAK, VANCORANDOM, GENTTROUGH, GENTPEAK, GENTRANDOM, TOBRATROUGH, TOBRAPEAK, TOBRARND, AMIKACINPEAK, AMIKACINTROU, AMIKACIN in the last 168 hours.  Estimated Creatinine Clearance: 101.2 mL/min (by C-G formula based on SCr of 0.87 mg/dL).    Allergies  Allergen Reactions  . Other     Pineapple-sick to stomach  . Penicillin V Rash   Thank you for allowing pharmacy to be a part of this patient's care.  Sloan Leiter, PharmD, BCPS Clinical Pharmacist [phone] 03/17/2016 7:01 PM

## 2016-03-18 ENCOUNTER — Encounter (HOSPITAL_COMMUNITY): Payer: Self-pay | Admitting: Orthopedic Surgery

## 2016-03-18 DIAGNOSIS — M50122 Cervical disc disorder at C5-C6 level with radiculopathy: Secondary | ICD-10-CM | POA: Diagnosis not present

## 2016-03-18 MED ORDER — OXYCODONE HCL 5 MG PO TABS
5.0000 mg | ORAL_TABLET | ORAL | Status: DC | PRN
  Administered 2016-03-18: 10 mg via ORAL
  Filled 2016-03-18: qty 2

## 2016-03-18 MED FILL — Thrombin For Soln 20000 Unit: CUTANEOUS | Qty: 1 | Status: AC

## 2016-03-18 NOTE — Progress Notes (Signed)
Pt doing well. Pt and wife given D/C instructions with Rx's, verbal understanding was provided. Pt's incision is clean and dry with no sign of infection. Pt's IV was removed prior to D/C. Pt D/C'd home via wheelchair @ [phone] per MD order. Pt is stable @ D/C and has no other needs at this time. Holli Humbles, RN

## 2016-03-18 NOTE — Anesthesia Postprocedure Evaluation (Signed)
Anesthesia Post Note  Patient: Manuel Patel  Procedure(s) Performed: Procedure(s) (LRB): ANTERIOR CERVICAL DECOMPRESSION FUSION CERVICAL 3-4, CERVICAL 4-5, CERVICAL 5-6 WITH INSTRUMENTATION AND ALLOGRAFT (N/A)  Patient location during evaluation: PACU Anesthesia Type: General Level of consciousness: sedated Pain management: satisfactory to patient Vital Signs Assessment: post-procedure vital signs reviewed and stable Respiratory status: spontaneous breathing Cardiovascular status: stable Anesthetic complications: no    Last Vitals:  Vitals:   03/18/16 0400 03/18/16 0738  BP: (!) 157/89 (!) 163/83  Pulse: (!) 59 (!) 55  Resp: 18 18  Temp: 36.8 C 36.8 C    Last Pain:  Vitals:   03/18/16 0815  TempSrc:   PainSc: [place]

## 2016-03-18 NOTE — Progress Notes (Signed)
    Patient doing well Patient denies R or L arm pain + dysphagia, as expected   Physical Exam: Vitals:   03/17/16 2338 03/18/16 0400  BP: (!) 152/82 (!) 157/89  Pulse: (!) 55 (!) 59  Resp: 20 18  Temp: 97.7 F (36.5 C) 98.2 F (36.8 C)    Dressing in place 1/5 left shoulder abduction (baseline)  POD #1 s/p C3-6 ACDF, doing well  - encourage ambulation - Percocet for pain, Valium for muscle spasms - dysphagia expected to improve, will monitor - likely d/c home today

## 2016-04-01 NOTE — Discharge Summary (Signed)
Patient ID: LARZ BISSETTE MRN: [id number] DOB/AGE: Feb 28, 1951 65 y.o.  Admit date: 03/17/2016 Discharge date: 03/18/2016  Admission Diagnoses:  Active Problems:   Radiculopathy   Discharge Diagnoses:  Same  Past Medical History:  Diagnosis Date  . Blind left eye   . Glaucoma, right eye   . History of bronchitis   . History of kidney stones   . Hypertension   . Numbness and tingling in left arm     Surgeries: Procedure(s): ANTERIOR CERVICAL DECOMPRESSION FUSION CERVICAL 3-4, CERVICAL 4-5, CERVICAL 5-6 WITH INSTRUMENTATION AND ALLOGRAFT on 03/17/2016   Consultants: None  Discharged Condition: Improved  Hospital Course: QUENTARIUS KADRI is an 65 y.o. male who was admitted 03/17/2016 for operative treatment of radiculopathy. Patient has severe unremitting pain that affects sleep, daily activities, and work/hobbies. After pre-op clearance the patient was taken to the operating room on 03/17/2016 and underwent  Procedure(s): ANTERIOR CERVICAL DECOMPRESSION FUSION CERVICAL 3-4, CERVICAL 4-5, CERVICAL 5-6 WITH INSTRUMENTATION AND ALLOGRAFT.    Patient was given perioperative antibiotics:  Anti-infectives    Start     Dose/Rate Route Frequency Ordered Stop   03/17/16 2330  vancomycin (VANCOCIN) IVPB 1000 mg/200 mL premix     1,000 mg 200 mL/hr over 60 Minutes Intravenous  Once 03/17/16 1903 03/18/16 0013   03/17/16 1400  ceFAZolin (ANCEF) IVPB 2g/100 mL premix  Status:  Discontinued     2 g 200 mL/hr over 30 Minutes Intravenous To ShortStay Surgical 03/16/16 1512 03/17/16 1817   03/17/16 1230  vancomycin (VANCOCIN) IVPB 1000 mg/200 mL premix     1,000 mg 200 mL/hr over 60 Minutes Intravenous To Surgery 03/17/16 1219 03/17/16 1335       Patient was given sequential compression devices, early ambulation to prevent DVT.  Patient benefited maximally from hospital stay and there were no complications.    Recent vital signs: BP (!) 163/83 (BP Location: Right Arm)   Pulse (!) 55   Temp 98.3  F (36.8 C) (Oral)   Resp 18   Wt 96.2 kg (212 lb)   SpO2 95%   BMI 30.42 kg/m    Discharge Medications:     Medication List    TAKE these medications   amLODipine-valsartan 5-160 MG tablet Commonly known as:  EXFORGE Take 1 tablet by mouth daily.   atenolol 50 MG tablet Commonly known as:  TENORMIN Take 50 mg by mouth 2 (two) times daily.   cholecalciferol 1000 units tablet Commonly known as:  VITAMIN D Take 1,000 Units by mouth daily.   multivitamin tablet Take 1 tablet by mouth daily.   timolol 0.5 % ophthalmic gel-forming Commonly known as:  TIMOPTIC-XR Place 1 drop into the right eye at bedtime.   vitamin C 100 MG tablet Take 100 mg by mouth daily.       Diagnostic Studies: Dg Chest 2 View  Result Date: 03/09/2016 CLINICAL DATA:  Preoperative examination prior to spinal surgery, history of bronchitis, hypertension, nonsmoker. EXAM: CHEST  2 VIEW COMPARISON:  None in PACs FINDINGS: The lungs are well-expanded. The interstitial markings are mildly prominent. The heart is top-normal in size. The pulmonary vascularity is prominent centrally. There is no interstitial or alveolar edema nor pleural effusion. The mediastinum is normal in width. There is multilevel degenerative disc disease of the thoracic spine. IMPRESSION: Mild central pulmonary vascular congestion and mild cardiomegaly consistent with low-grade CHF. No alveolar edema or pneumonia. Electronically Signed   By: David  Martinique M.D.   On:  03/09/2016 16:44   Dg Cervical Spine 1 View  Result Date: 03/17/2016 CLINICAL DATA:  C3-C6 anterior fusion EXAM: DG C-ARM 61-120 MIN; DG CERVICAL SPINE - 1 VIEW COMPARISON:  01/31/2016 FLUOROSCOPY TIME:  Radiation Exposure Index (as provided by the fluoroscopic device): Not available If the device does not provide the exposure index: Fluoroscopy Time:  Not available Number of Acquired Images:  1 FINDINGS: There are changes consistent with interbody fusion at C3-4, C4-5 and C5-6  with anterior fixation. No acute abnormality is noted. IMPRESSION: Cervical fusion as described. Electronically Signed   By: Inez Catalina M.D.   On: 03/17/2016 15:26   Dg C-arm 1-60 Min  Result Date: 03/17/2016 CLINICAL DATA:  C3-C6 anterior fusion EXAM: DG C-ARM 61-120 MIN; DG CERVICAL SPINE - 1 VIEW COMPARISON:  01/31/2016 FLUOROSCOPY TIME:  Radiation Exposure Index (as provided by the fluoroscopic device): Not available If the device does not provide the exposure index: Fluoroscopy Time:  Not available Number of Acquired Images:  1 FINDINGS: There are changes consistent with interbody fusion at C3-4, C4-5 and C5-6 with anterior fixation. No acute abnormality is noted. IMPRESSION: Cervical fusion as described. Electronically Signed   By: Inez Catalina M.D.   On: 03/17/2016 15:26    Disposition: 01-Home or Self Care   POD #1 s/p C3-6 ACDF, doing well  - encourage ambulation - Percocet for pain, Valium for muscle spasms - dysphagia expected to improve, will monitor -Written scripts for pain signed and in chart -D/C instructions sheet printed and in chart -D/C today  -F/U in office 2 weeks   Signed: Justice Britain 04/01/2016, 7:31 AM

## 2016-10-22 DIAGNOSIS — K219 Gastro-esophageal reflux disease without esophagitis: Secondary | ICD-10-CM | POA: Insufficient documentation

## 2016-10-22 DIAGNOSIS — R1314 Dysphagia, pharyngoesophageal phase: Secondary | ICD-10-CM | POA: Insufficient documentation

## 2016-11-05 DIAGNOSIS — R339 Retention of urine, unspecified: Secondary | ICD-10-CM | POA: Insufficient documentation

## 2016-11-05 DIAGNOSIS — N411 Chronic prostatitis: Secondary | ICD-10-CM | POA: Insufficient documentation

## 2017-08-09 DIAGNOSIS — C801 Malignant (primary) neoplasm, unspecified: Secondary | ICD-10-CM

## 2017-08-09 HISTORY — DX: Malignant (primary) neoplasm, unspecified: C80.1

## 2017-08-18 DIAGNOSIS — N320 Bladder-neck obstruction: Secondary | ICD-10-CM | POA: Insufficient documentation

## 2017-08-18 DIAGNOSIS — C61 Malignant neoplasm of prostate: Secondary | ICD-10-CM | POA: Insufficient documentation

## 2018-01-10 ENCOUNTER — Encounter: Payer: Self-pay | Admitting: Urology

## 2018-01-10 ENCOUNTER — Ambulatory Visit (INDEPENDENT_AMBULATORY_CARE_PROVIDER_SITE_OTHER): Payer: Federal, State, Local not specified - PPO | Admitting: Urology

## 2018-01-10 VITALS — BP 135/74 | HR 58 | Ht 67.9 in | Wt 221.7 lb

## 2018-01-10 DIAGNOSIS — C61 Malignant neoplasm of prostate: Secondary | ICD-10-CM | POA: Diagnosis not present

## 2018-01-10 NOTE — Progress Notes (Signed)
01/10/2018 1:44 PM   Larey Seat Feb 13, 1951 [id number]  Referring provider: Albina Billet, MD [address]   [place], [place] [address]  Chief Complaint  Patient presents with  . Prostate Cancer    HPI: 67 year old male presents for transfer of care for prostate cancer.  He has been seeing Dr. Jacqlyn Larsen at [facility] and underwent prostate biopsy and December 2018 for PSA of 4.2.  His prostate volume was 31 cc.  He had positive cores at the left mid and left apical region for Gleason 3+3 adenocarcinoma involving 10 and 30% of the submitted tissue respectively.  After discussing options he elected active surveillance.  He has no bothersome lower urinary tract symptoms.  Denies dysuria or gross hematuria.  Denies flank, abdominal, pelvic or scrotal pain.  He does have a prior history of stone disease.   PMH: Past Medical History:  Diagnosis Date  . Blind left eye   . Glaucoma, right eye   . History of bronchitis   . History of kidney stones   . Hypertension   . Numbness and tingling in left arm     Surgical History: Past Surgical History:  Procedure Laterality Date  . ANKLE SURGERY Right   . ANKLE SURGERY    . ANTERIOR CERVICAL DECOMP/DISCECTOMY FUSION N/A 03/17/2016   Procedure: ANTERIOR CERVICAL DECOMPRESSION FUSION CERVICAL 3-4, CERVICAL 4-5, CERVICAL 5-6 WITH INSTRUMENTATION AND ALLOGRAFT;  Surgeon: Phylliss Bob, MD;  Location: [facility];  Service: Orthopedics;  Laterality: N/A;  ANTERIOR CERVICAL DECOMPRESSION FUSION CERVICAL 3-4, CERVICAL 4-5, CERVICAL 5-6 WITH INSTRUMENTATION AND ALLOGRAFT  . COLONOSCOPY    . EYE SURGERY Left    prosthetic left eye  . EYE SURGERY Right    cataract    Home Medications:  Allergies as of 01/10/2018      Reactions   Other    Pineapple-sick to stomach   Penicillin V Rash   Pineapple Nausea Only      Medication List        Accurate as of 01/10/18  1:44 PM. Always use your most recent med list.          amLODipine 10 MG tablet Commonly  known as:  NORVASC   atenolol 50 MG tablet Commonly known as:  TENORMIN Take 50 mg by mouth 2 (two) times daily.   cholecalciferol 1000 units tablet Commonly known as:  VITAMIN D Take 1,000 Units by mouth daily.   multivitamin tablet Take 1 tablet by mouth daily.   tamsulosin 0.4 MG Caps capsule Commonly known as:  FLOMAX   timolol 0.5 % ophthalmic gel-forming Commonly known as:  TIMOPTIC-XR Place 1 drop into the right eye at bedtime.   vitamin C 100 MG tablet Take 100 mg by mouth daily.       Allergies:  Allergies  Allergen Reactions  . Other     Pineapple-sick to stomach  . Penicillin V Rash  . Pineapple Nausea Only    Family History: No family history on file.  Social History:  reports that he has never smoked. He has never used smokeless tobacco. He reports that he does not drink alcohol or use drugs.  ROS: UROLOGY Frequent Urination?: No Hard to postpone urination?: No Burning/pain with urination?: No Get up at night to urinate?: No Leakage of urine?: No Urine stream starts and stops?: No Trouble starting stream?: No Do you have to strain to urinate?: No Blood in urine?: No Urinary tract infection?: No Sexually transmitted disease?: No Injury  to kidneys or bladder?: No Painful intercourse?: No Weak stream?: No Erection problems?: No Penile pain?: No  Gastrointestinal Nausea?: No Vomiting?: No Indigestion/heartburn?: No Diarrhea?: No Constipation?: No  Constitutional Fever: No Night sweats?: No Weight loss?: No Fatigue?: No  Skin Skin rash/lesions?: No Itching?: No  Eyes Blurred vision?: No Double vision?: No  Ears/Nose/Throat Sore throat?: No Sinus problems?: No  Hematologic/Lymphatic Swollen glands?: No Easy bruising?: No  Cardiovascular Leg swelling?: No Chest pain?: No  Respiratory Cough?: No Shortness of breath?: No  Endocrine Excessive thirst?: No  Musculoskeletal Back pain?: Yes Joint pain?:  Yes  Neurological Headaches?: No Dizziness?: No  Psychologic Depression?: No Anxiety?: No  Physical Exam: BP 135/74 (BP Location: Right Arm, Patient Position: Sitting, Cuff Size: Large)   Pulse (!) 58   Ht 5' 7.9" (1.725 m)   Wt 221 lb 11.2 oz (100.6 kg)   SpO2 99%   BMI 33.81 kg/m   Constitutional:  Alert and oriented, No acute distress. HEENT: [place] AT, moist mucus membranes.  Trachea midline, no masses. Cardiovascular: No clubbing, cyanosis, or edema. Respiratory: Normal respiratory effort, no increased work of breathing. GI: Abdomen is soft, nontender, nondistended, no abdominal masses GU: No CVA tenderness.  Prostate 30 g, smooth without nodules. Lymph: No cervical or inguinal lymphadenopathy. Skin: No rashes, bruises or suspicious lesions. Neurologic: Grossly intact, no focal deficits, moving all 4 extremities. Psychiatric: Normal mood and affect.   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  DRE is benign.  PSA ordered today and is stable he will follow-up in 4 months.  He will be notified with results and further recommendations.   Return in about 4 years (around 01/10/2022) for Recheck, PSA.  Abbie Sons, [place] [address], [place], [place] [address] [phone]

## 2018-01-11 ENCOUNTER — Other Ambulatory Visit: Payer: Self-pay | Admitting: Urology

## 2018-01-11 ENCOUNTER — Telehealth: Payer: Self-pay | Admitting: Urology

## 2018-01-11 DIAGNOSIS — C61 Malignant neoplasm of prostate: Secondary | ICD-10-CM

## 2018-01-11 LAB — PSA: Prostate Specific Ag, Serum: 4.6 ng/mL — ABNORMAL HIGH (ref 0.0–4.0)

## 2018-01-11 NOTE — Telephone Encounter (Signed)
-----   Message from Abbie Sons, MD sent at 01/11/2018  7:59 AM EDT ----- PSA has increased to 4.6.  Recommend scheduling prostate MRI.

## 2018-01-11 NOTE — Telephone Encounter (Signed)
Spoke with patient and gave him his results and told him about the [facility]

## 2018-01-17 ENCOUNTER — Encounter: Payer: Self-pay | Admitting: *Deleted

## 2018-01-23 ENCOUNTER — Other Ambulatory Visit: Payer: Self-pay

## 2018-01-31 ENCOUNTER — Ambulatory Visit
Admission: RE | Admit: 2018-01-31 | Discharge: 2018-01-31 | Disposition: A | Discharge status: Home / Self Care | Payer: Federal, State, Local not specified - PPO | Source: Ambulatory Visit | Attending: Urology | Admitting: Urology

## 2018-01-31 DIAGNOSIS — C61 Malignant neoplasm of prostate: Secondary | ICD-10-CM

## 2018-01-31 MED ORDER — GADOBENATE DIMEGLUMINE 529 MG/ML IV SOLN
20.0000 mL | Freq: Once | INTRAVENOUS | Status: AC | PRN
  Administered 2018-01-31: 20 mL via INTRAVENOUS

## 2018-02-03 ENCOUNTER — Telehealth: Payer: Self-pay | Admitting: Urology

## 2018-02-03 NOTE — Telephone Encounter (Signed)
Pt called office stating he missed your call while in the shower, apologizes and asks for a return call about his results, Please advise. Thanks.

## 2018-02-06 ENCOUNTER — Telehealth: Payer: Self-pay | Admitting: Urology

## 2018-02-06 NOTE — Telephone Encounter (Signed)
Pt calling office asking about his results. States he called last week and hasn't heard anything. Please advise pt @ [phone]. Thanks.

## 2018-02-06 NOTE — Telephone Encounter (Signed)
Pt returning your call, states you called him about his prostate mri.

## 2018-02-07 NOTE — Telephone Encounter (Signed)
I contacted Manuel Patel regarding his MRI results.  He was informed of the PI-RADS 4 lesion which may be indicative of a high-grade prostate cancer and would recommend scheduling a fusion biopsy at [facility]  Please schedule fusion biopsy

## 2018-02-14 ENCOUNTER — Telehealth: Payer: Self-pay | Admitting: Urology

## 2018-02-14 DIAGNOSIS — C61 Malignant neoplasm of prostate: Secondary | ICD-10-CM

## 2018-02-14 NOTE — Telephone Encounter (Signed)
Please put a referral in for the fusion biopsy   Thanks, Sharyn Lull

## 2018-02-15 NOTE — Telephone Encounter (Signed)
Order was entered 

## 2018-02-21 ENCOUNTER — Encounter: Payer: Self-pay | Admitting: General Surgery

## 2018-02-21 ENCOUNTER — Ambulatory Visit: Payer: Federal, State, Local not specified - PPO | Admitting: General Surgery

## 2018-02-21 VITALS — BP 120/66 | HR 72 | Resp 14 | Ht 70.0 in | Wt 221.0 lb

## 2018-02-21 DIAGNOSIS — Z1211 Encounter for screening for malignant neoplasm of colon: Secondary | ICD-10-CM

## 2018-02-21 MED ORDER — POLYETHYLENE GLYCOL 3350 17 GM/SCOOP PO POWD: 1.0000 | Freq: Once | ORAL | 0 refills | Status: AC

## 2018-02-21 NOTE — Progress Notes (Signed)
Patient ID: Manuel Patel, male   DOB: March 06, 1951, 67 y.o.   MRN: [id number]  Chief Complaint  Patient presents with  . Colonoscopy    HPI Manuel Patel is a 67 y.o. male Here today for a evaluation of a screening colonoscopy.Last colonoscopy was in 2009. Patient states no GI problems at this time. Moves his bowels daily.  HPI  Past Medical History:  Diagnosis Date  . Blind left eye   . Cancer [facility]) 2019   prostate  . Glaucoma, right eye   . History of bronchitis   . History of kidney stones   . Hypertension   . Numbness and tingling in left arm     Past Surgical History:  Procedure Laterality Date  . ANKLE SURGERY Right   . ANKLE SURGERY    . ANTERIOR CERVICAL DECOMP/DISCECTOMY FUSION N/A 03/17/2016   Procedure: ANTERIOR CERVICAL DECOMPRESSION FUSION CERVICAL 3-4, CERVICAL 4-5, CERVICAL 5-6 WITH INSTRUMENTATION AND ALLOGRAFT;  Surgeon: Phylliss Bob, MD;  Location: [facility];  Service: Orthopedics;  Laterality: N/A;  ANTERIOR CERVICAL DECOMPRESSION FUSION CERVICAL 3-4, CERVICAL 4-5, CERVICAL 5-6 WITH INSTRUMENTATION AND ALLOGRAFT  . COLONOSCOPY    . EYE SURGERY Left    prosthetic left eye  . EYE SURGERY Right    cataract    No family history on file.  Social History Social History   Tobacco Use  . Smoking status: Never Smoker  . Smokeless tobacco: Never Used  Substance Use Topics  . Alcohol use: No  . Drug use: No    Allergies  Allergen Reactions  . Other     Pineapple-sick to stomach  . Penicillin V Rash  . Pineapple Nausea Only    Current Outpatient Medications  Medication Sig Dispense Refill  . amLODipine (NORVASC) 10 MG tablet     . atenolol (TENORMIN) 50 MG tablet Take 50 mg by mouth 2 (two) times daily.  0  . Multiple Vitamin (MULTIVITAMIN) tablet Take 1 tablet by mouth daily.    . tamsulosin (FLOMAX) 0.4 MG CAPS capsule     . timolol (TIMOPTIC-XR) 0.5 % ophthalmic gel-forming Place 1 drop into the right eye at bedtime.  0  . Ascorbic Acid (VITAMIN C) 100 MG  tablet Take 100 mg by mouth daily.    . cholecalciferol (VITAMIN D) 1000 units tablet Take 1,000 Units by mouth daily.     No current facility-administered medications for this visit.     Review of Systems Review of Systems  Constitutional: Negative.   Respiratory: Negative.   Cardiovascular: Negative.     Blood pressure 120/66, pulse 72, resp. rate 14, height _0  (1.778 m), weight 221 lb (100.2 kg).  Physical Exam Physical Exam  Constitutional: He is oriented to person, place, and time. He appears well-developed and well-nourished.  Cardiovascular: Normal rate, regular rhythm and normal heart sounds.  Pulmonary/Chest: Effort normal and breath sounds normal.  Neurological: He is alert and oriented to person, place, and time.  Skin: Skin is warm and dry.    Data Reviewed Laboratory studies dated February 16, 2017 showed a creatinine of 1.09 with an EGFR of 71.  Normal electrolytes.  Liver function studies.  Blood sugar 92.  Colonoscopy dated Dec 25, 2017 showed a single diverticulum in the sigmoid colon.  Assessment    Candidate for screening colonoscopy.    Plan  Colonoscopy with possible biopsy/polypectomy prn: Information regarding the procedure, including its potential risks and complications (including but not limited to perforation of the bowel,  which may require emergency surgery to repair, and bleeding) was verbally given to the patient. Educational information regarding lower intestinal endoscopy was given to the patient. Written instructions for how to complete the bowel prep using Miralax were provided. The importance of drinking ample fluids to avoid dehydration as a result of the prep emphasized.  HPI, Physical Exam, Assessment and Plan have been scribed under the direction and in the presence of Hervey Ard, MD. Gaspar Cola, CMA  I have completed the exam and reviewed the above documentation for accuracy and completeness.  I agree with the above.  Development worker, community has been used and any errors in dictation or transcription are unintentional.  Hervey Ard, M.D., F.A.C.S.  The patient is scheduled for a Colonoscopy at [facility] on 03/29/18. They are aware to call the day before to get their arrival time. He will only take his Atenolol the morning of at 6 am with a sip of water. Miralax prescription has been sent into the patient's pharmacy. The patient is aware of date and instructions.  Documented by Caryl-Lyn Derrich Brace LPN   Forest Gleason [place] 02/22/2018, 7:06 AM

## 2018-02-21 NOTE — Patient Instructions (Addendum)
Colonoscopy, Adult A colonoscopy is an exam to look at the entire large intestine. During the exam, a lubricated, bendable tube is inserted into the anus and then passed into the rectum, colon, and other parts of the large intestine. A colonoscopy is often done as a part of normal colorectal screening or in response to certain symptoms, such as anemia, persistent diarrhea, abdominal pain, and blood in the stool. The exam can help screen for and diagnose medical problems, including:  Tumors.  Polyps.  Inflammation.  Areas of bleeding.  Tell a health care provider about:  Any allergies you have.  All medicines you are taking, including vitamins, herbs, eye drops, creams, and over-the-counter medicines.  Any problems you or family members have had with anesthetic medicines.  Any blood disorders you have.  Any surgeries you have had.  Any medical conditions you have.  Any problems you have had passing stool. What are the risks? Generally, this is a safe procedure. However, problems may occur, including:  Bleeding.  A tear in the intestine.  A reaction to medicines given during the exam.  Infection (rare).  What happens before the procedure? Eating and drinking restrictions Follow instructions from your health care provider about eating and drinking, which may include:  A few days before the procedure - follow a low-fiber diet. Avoid nuts, seeds, dried fruit, raw fruits, and vegetables.  1-3 days before the procedure - follow a clear liquid diet. Drink only clear liquids, such as clear broth or bouillon, black coffee or tea, clear juice, clear soft drinks or sports drinks, gelatin dessert, and popsicles. Avoid any liquids that contain red or purple dye.  On the day of the procedure - do not eat or drink anything during the 2 hours before the procedure, or within the time period that your health care provider recommends.  Bowel prep If you were prescribed an oral bowel prep  to clean out your colon:  Take it as told by your health care provider. Starting the day before your procedure, you will need to drink a large amount of medicated liquid. The liquid will cause you to have multiple loose stools until your stool is almost clear or light green.  If your skin or anus gets irritated from diarrhea, you may use these to relieve the irritation: ? Medicated wipes, such as adult wet wipes with aloe and vitamin E. ? A skin soothing-product like petroleum jelly.  If you vomit while drinking the bowel prep, take a break for up to 60 minutes and then begin the bowel prep again. If vomiting continues and you cannot take the bowel prep without vomiting, call your health care provider.  General instructions  Ask your health care provider about changing or stopping your regular medicines. This is especially important if you are taking diabetes medicines or blood thinners.  Plan to have someone take you home from the hospital or clinic. What happens during the procedure?  An IV tube may be inserted into one of your veins.  You will be given medicine to help you relax (sedative).  To reduce your risk of infection: ? Your health care team will wash or sanitize their hands. ? Your anal area will be washed with soap.  You will be asked to lie on your side with your knees bent.  Your health care provider will lubricate a long, thin, flexible tube. The tube will have a camera and a light on the end.  The tube will be inserted into your   anus.  The tube will be gently eased through your rectum and colon.  Air will be delivered into your colon to keep it open. You may feel some pressure or cramping.  The camera will be used to take images during the procedure.  A small tissue sample may be removed from your body to be examined under a microscope (biopsy). If any potential problems are found, the tissue will be sent to a lab for testing.  If small polyps are found, your  health care provider may remove them and have them checked for cancer cells.  The tube that was inserted into your anus will be slowly removed. The procedure may vary among health care providers and hospitals. What happens after the procedure?  Your blood pressure, heart rate, breathing rate, and blood oxygen level will be monitored until the medicines you were given have worn off.  Do not drive for 24 hours after the exam.  You may have a small amount of blood in your stool.  You may pass gas and have mild abdominal cramping or bloating due to the air that was used to inflate your colon during the exam.  It is up to you to get the results of your procedure. Ask your health care provider, or the department performing the procedure, when your results will be ready. This information is not intended to replace advice given to you by your health care provider. Make sure you discuss any questions you have with your health care provider. Document Released: 07/23/2000 Document Revised: 05/26/2016 Document Reviewed: 10/07/2015 Elsevier Interactive Patient Education  [employer].  The patient is scheduled for a Colonoscopy at [facility] on 03/29/18. They are aware to call the day before to get their arrival time. He will only take his Atenolol the morning of at 6 am with a sip of water. Miralax prescription has been sent into the patient's pharmacy. The patient is aware of date and instructions.

## 2018-02-22 DIAGNOSIS — Z1211 Encounter for screening for malignant neoplasm of colon: Secondary | ICD-10-CM | POA: Insufficient documentation

## 2018-03-24 ENCOUNTER — Telehealth: Payer: Self-pay | Admitting: Urology

## 2018-03-24 NOTE — Telephone Encounter (Signed)
Patient had his Fusion biopsy today, do you want him to follow up for results or will you call him?  Sharyn Lull

## 2018-03-26 NOTE — Telephone Encounter (Signed)
I will call 

## 2018-03-28 ENCOUNTER — Other Ambulatory Visit: Payer: Self-pay | Admitting: Urology

## 2018-03-29 ENCOUNTER — Encounter
Admission: RE | Disposition: A | Discharge status: Home / Self Care | Payer: Self-pay | Source: Ambulatory Visit | Attending: General Surgery

## 2018-03-29 ENCOUNTER — Ambulatory Visit: Payer: Federal, State, Local not specified - PPO | Admitting: Certified Registered Nurse Anesthetist

## 2018-03-29 ENCOUNTER — Ambulatory Visit
Admission: RE | Admit: 2018-03-29 | Discharge: 2018-03-29 | Disposition: A | Discharge status: Home / Self Care | Payer: Federal, State, Local not specified - PPO | Source: Ambulatory Visit | Attending: General Surgery | Admitting: General Surgery

## 2018-03-29 DIAGNOSIS — I10 Essential (primary) hypertension: Secondary | ICD-10-CM | POA: Diagnosis not present

## 2018-03-29 DIAGNOSIS — Z1211 Encounter for screening for malignant neoplasm of colon: Secondary | ICD-10-CM

## 2018-03-29 DIAGNOSIS — K5730 Diverticulosis of large intestine without perforation or abscess without bleeding: Secondary | ICD-10-CM | POA: Diagnosis not present

## 2018-03-29 DIAGNOSIS — Z79899 Other long term (current) drug therapy: Secondary | ICD-10-CM | POA: Insufficient documentation

## 2018-03-29 HISTORY — PX: COLONOSCOPY WITH PROPOFOL: SHX5780

## 2018-03-29 SURGERY — COLONOSCOPY WITH PROPOFOL
Anesthesia: General

## 2018-03-29 MED ORDER — SODIUM CHLORIDE 0.9 % IV SOLN
INTRAVENOUS | Status: DC
  Administered 2018-03-29: 1000 mL via INTRAVENOUS

## 2018-03-29 MED ORDER — PROPOFOL 500 MG/50ML IV EMUL
INTRAVENOUS | Status: AC
  Filled 2018-03-29: qty 50

## 2018-03-29 MED ORDER — LIDOCAINE HCL (CARDIAC) PF 100 MG/5ML IV SOSY
PREFILLED_SYRINGE | INTRAVENOUS | Status: DC | PRN
  Administered 2018-03-29: 50 mg via INTRAVENOUS

## 2018-03-29 MED ORDER — PROPOFOL 10 MG/ML IV BOLUS
INTRAVENOUS | Status: DC | PRN
  Administered 2018-03-29: 30 mg via INTRAVENOUS
  Administered 2018-03-29: 70 mg via INTRAVENOUS

## 2018-03-29 MED ORDER — PROPOFOL 500 MG/50ML IV EMUL
INTRAVENOUS | Status: DC | PRN
  Administered 2018-03-29: 175 ug/kg/min via INTRAVENOUS

## 2018-03-29 NOTE — H&P (Signed)
Manuel Patel [id number] [birth year]     HPI:  67 yo male for screening colonoscopy. Underwent MRI guided prostate biopsy 5 days ago. Small amount of rectal/ urethral bleeding for < 48 hours. Presently, asymptomatic.   Tolerated prep well.    Medications Prior to Admission  Medication Sig Dispense Refill Last Dose  . amLODipine (NORVASC) 10 MG tablet    03/28/2018 at Unknown time  . atenolol (TENORMIN) 50 MG tablet Take 50 mg by mouth 2 (two) times daily.  0 03/29/2018 at Unknown time  . tamsulosin (FLOMAX) 0.4 MG CAPS capsule    03/28/2018 at Unknown time  . timolol (TIMOPTIC-XR) 0.5 % ophthalmic gel-forming Place 1 drop into the right eye at bedtime.  0 03/28/2018 at Unknown time  . Ascorbic Acid (VITAMIN C) 100 MG tablet Take 100 mg by mouth daily.   Not Taking  . cholecalciferol (VITAMIN D) 1000 units tablet Take 1,000 Units by mouth daily.   Taking  . Multiple Vitamin (MULTIVITAMIN) tablet Take 1 tablet by mouth daily.   Taking   Allergies  Allergen Reactions  . Other     Pineapple-sick to stomach  . Penicillin V Rash  . Pineapple Nausea Only   Past Medical History:  Diagnosis Date  . Blind left eye   . Cancer [facility]) 2019   prostate  . Glaucoma, right eye   . History of bronchitis   . History of kidney stones   . Hypertension   . Numbness and tingling in left arm    Past Surgical History:  Procedure Laterality Date  . ANKLE SURGERY Right   . ANKLE SURGERY    . ANTERIOR CERVICAL DECOMP/DISCECTOMY FUSION N/A 03/17/2016   Procedure: ANTERIOR CERVICAL DECOMPRESSION FUSION CERVICAL 3-4, CERVICAL 4-5, CERVICAL 5-6 WITH INSTRUMENTATION AND ALLOGRAFT;  Surgeon: Phylliss Bob, MD;  Location: [facility];  Service: Orthopedics;  Laterality: N/A;  ANTERIOR CERVICAL DECOMPRESSION FUSION CERVICAL 3-4, CERVICAL 4-5, CERVICAL 5-6 WITH INSTRUMENTATION AND ALLOGRAFT  . COLONOSCOPY    . EYE SURGERY Left    prosthetic left eye  . EYE SURGERY Right    cataract   Social History   Socioeconomic  History  . Marital status: Married    Spouse name: Not on file  . Number of children: Not on file  . Years of education: Not on file  . Highest education level: Not on file  Occupational History  . Not on file  Social Needs  . Financial resource strain: Not on file  . Food insecurity:    Worry: Not on file    Inability: Not on file  . Transportation needs:    Medical: Not on file    Non-medical: Not on file  Tobacco Use  . Smoking status: Never Smoker  . Smokeless tobacco: Never Used  Substance and Sexual Activity  . Alcohol use: No  . Drug use: No  . Sexual activity: Yes  Lifestyle  . Physical activity:    Days per week: Not on file    Minutes per session: Not on file  . Stress: Not on file  Relationships  . Social connections:    Talks on phone: Not on file    Gets together: Not on file    Attends religious service: Not on file    Active member of club or organization: Not on file    Attends meetings of clubs or organizations: Not on file    Relationship status: Not on file  . Intimate partner violence:  Fear of current or ex partner: Not on file    Emotionally abused: Not on file    Physically abused: Not on file    Forced sexual activity: Not on file  Other Topics Concern  . Not on file  Social History Narrative  . Not on file   Social History   Social History Narrative  . Not on file     ROS: Negative.     PE: HEENT: Negative. Lungs: Clear. Cardio: RR.  Assessment/Plan:  Proceed with planned endoscopy. Forest Gleason [facility] 03/29/2018

## 2018-03-29 NOTE — Anesthesia Preprocedure Evaluation (Addendum)
Anesthesia Evaluation  Patient identified by MRN, date of birth, ID band Patient awake    Reviewed: Allergy & Precautions, H&P , NPO status , Patient's Chart, lab work & pertinent test results  Airway Mallampati: III  TM Distance: >3 FB Neck ROM: full    Dental no notable dental hx. (+) Teeth Intact   Pulmonary neg pulmonary ROS,           Cardiovascular hypertension, negative cardio ROS       Neuro/Psych negative neurological ROS  negative psych ROS   GI/Hepatic negative GI ROS, Neg liver ROS,   Endo/Other  negative endocrine ROS  Renal/GU negative Renal ROS  negative genitourinary   Musculoskeletal   Abdominal   Peds  Hematology negative hematology ROS (+)   Anesthesia Other Findings Past Medical History: No date: Blind left eye 2019: Cancer ([facility])     Comment:  prostate No date: Glaucoma, right eye No date: History of bronchitis No date: History of kidney stones No date: Hypertension No date: Numbness and tingling in left arm  Past Surgical History: No date: ANKLE SURGERY; Right No date: ANKLE SURGERY 03/17/2016: ANTERIOR CERVICAL DECOMP/DISCECTOMY FUSION; N/A     Comment:  Procedure: ANTERIOR CERVICAL DECOMPRESSION FUSION               CERVICAL 3-4, CERVICAL 4-5, CERVICAL 5-6 WITH               INSTRUMENTATION AND ALLOGRAFT;  Surgeon: Phylliss Bob,               MD;  Location: [facility];  Service: Orthopedics;  Laterality:              N/A;  ANTERIOR CERVICAL DECOMPRESSION FUSION CERVICAL               3-4, CERVICAL 4-5, CERVICAL 5-6 WITH INSTRUMENTATION AND               ALLOGRAFT No date: COLONOSCOPY No date: EYE SURGERY; Left     Comment:  prosthetic left eye No date: EYE SURGERY; Right     Comment:  cataract  BMI    Body Mass Index:  31.57 kg/m      Reproductive/Obstetrics negative OB ROS                            Anesthesia Physical Anesthesia Plan  ASA:  II  Anesthesia Plan: General   Post-op Pain Management:    Induction:   PONV Risk Score and Plan: Propofol infusion  Airway Management Planned:   Additional Equipment:   Intra-op Plan:   Post-operative Plan:   Informed Consent: I have reviewed the patients History and Physical, chart, labs and discussed the procedure including the risks, benefits and alternatives for the proposed anesthesia with the patient or authorized representative who has indicated his/her understanding and acceptance.   Dental Advisory Given  Plan Discussed with: Anesthesiologist, CRNA and Surgeon  Anesthesia Plan Comments:        Anesthesia Quick Evaluation

## 2018-03-29 NOTE — Anesthesia Postprocedure Evaluation (Signed)
Anesthesia Post Note  Patient: Manuel Patel  Procedure(s) Performed: COLONOSCOPY WITH PROPOFOL (N/A )  Patient location during evaluation: PACU Anesthesia Type: General Level of consciousness: awake and alert Pain management: pain level controlled Vital Signs Assessment: post-procedure vital signs reviewed and stable Respiratory status: spontaneous breathing, nonlabored ventilation and respiratory function stable Cardiovascular status: blood pressure returned to baseline and stable Postop Assessment: no apparent nausea or vomiting Anesthetic complications: no     Last Vitals:  Vitals:   03/29/18 0910 03/29/18 0914  BP: 118/74 118/74  Pulse: (!) 51 (!) 50  Resp: 16 17  Temp:    SpO2: 97% 99%    Last Pain:  Vitals:   03/29/18 0858  TempSrc: Tympanic  PainSc:                  Durenda Hurt

## 2018-03-29 NOTE — Op Note (Signed)
[facility] Gastroenterology Patient Name: Manuel Patel Procedure Date: 03/29/2018 8:26 AM MRN: [id number] Account #: [id number] Date of Birth: 21-Nov-1950 Admit Type: Outpatient Age: 67 Room: [facility] ENDO ROOM 1 Gender: Male Note Status: Finalized Procedure:            Colonoscopy Indications:          Screening for colorectal malignant neoplasm Providers:            Robert Bellow, MD Referring MD:         Leona Carry. Hall Busing, MD (Referring MD) Medicines:            Monitored Anesthesia Care Complications:        No immediate complications. Procedure:            Pre-Anesthesia Assessment:                       - Prior to the procedure, a History and Physical was                        performed, and patient medications, allergies and                        sensitivities were reviewed. The patient's tolerance of                        previous anesthesia was reviewed.                       - The risks and benefits of the procedure and the                        sedation options and risks were discussed with the                        patient. All questions were answered and informed                        consent was obtained.                       After obtaining informed consent, the colonoscope was                        passed under direct vision. Throughout the procedure,                        the patient's blood pressure, pulse, and oxygen                        saturations were monitored continuously. The                        Colonoscope was introduced through the anus and                        advanced to the the cecum, identified by appendiceal                        orifice and ileocecal valve. The colonoscopy was  performed without difficulty. The patient tolerated the                        procedure well. The quality of the bowel preparation                        was excellent.                       Changes noted on the anterior  rectal mucosa from his                        recent prostate biopsies. Findings:      A few small-mouthed diverticula were found in the sigmoid colon. Impression:           - Diverticulosis in the sigmoid colon.                       - No specimens collected. Recommendation:       - Repeat colonoscopy in 10 years for screening purposes. Procedure Code(s):    --- Professional ---                       [phone], Colonoscopy, flexible; diagnostic, including                        collection of specimen(s) by brushing or washing, when                        performed (separate procedure) Diagnosis Code(s):    --- Professional ---                       K57.30, Diverticulosis of large intestine without                        perforation or abscess without bleeding                       Z12.11, Encounter for screening for malignant neoplasm                        of colon CPT copyright 2017 American Medical Association. All rights reserved. The codes documented in this report are preliminary and upon coder review may  be revised to meet current compliance requirements. Robert Bellow, MD 03/29/2018 8:56:50 AM This report has been signed electronically. Number of Addenda: 0 Note Initiated On: 03/29/2018 8:26 AM Scope Withdrawal Time: 0 hours 12 minutes 24 seconds  Total Procedure Duration: 0 hours 16 minutes 9 seconds       [facility]

## 2018-03-29 NOTE — Transfer of Care (Signed)
Immediate Anesthesia Transfer of Care Note  Patient: Manuel Patel  Procedure(s) Performed: COLONOSCOPY WITH PROPOFOL (N/A )  Patient Location: PACU  Anesthesia Type:General  Level of Consciousness: awake, alert  and oriented  Airway & Oxygen Therapy: Patient Spontanous Breathing and Patient connected to nasal cannula oxygen  Post-op Assessment: Report given to RN and Post -op Vital signs reviewed and stable  Post vital signs: Reviewed and stable  Last Vitals:  Vitals Value Taken Time  BP 104/61 03/29/2018  9:01 AM  Temp 36 C 03/29/2018  8:58 AM  Pulse 53 03/29/2018  9:01 AM  Resp 12 03/29/2018  9:01 AM  SpO2 100 % 03/29/2018  9:01 AM    Last Pain:  Vitals:   03/29/18 0858  TempSrc: Tympanic  PainSc:          Complications: No apparent anesthesia complications

## 2018-03-29 NOTE — Anesthesia Procedure Notes (Signed)
Date/Time: 03/29/2018 8:33 AM Performed by: Johnna Acosta, CRNA Pre-anesthesia Checklist: Patient identified, Emergency Drugs available, Suction available, Patient being monitored and Timeout performed Patient Re-evaluated:Patient Re-evaluated prior to induction Oxygen Delivery Method: Nasal cannula Preoxygenation: Pre-oxygenation with 100% oxygen

## 2018-03-29 NOTE — Anesthesia Post-op Follow-up Note (Signed)
Anesthesia QCDR form completed.        

## 2018-03-30 ENCOUNTER — Encounter: Payer: Self-pay | Admitting: General Surgery

## 2018-03-31 NOTE — Telephone Encounter (Signed)
Fusion Bx results are in.  Please contact patient with results at [phone].

## 2018-04-03 NOTE — Telephone Encounter (Signed)
Manuel Patel was contacted regarding his confirmatory fusion biopsy results.  2/4 targeted cores were positive for Gleason 3+3 adenocarcinoma involving 5%.  The left apical biopsy showed Gleason 3+3 adenocarcinoma involving 10% of the submitted tissue.  He was informed the follow-up biopsy confirms the presence of low-grade, low-volume disease and continued active surveillance is appropriate.  He elects to continue active surveillance.  He is scheduled for a follow-up in October however since his biopsy was this month will bump him out to November.  Front staff please cancel his October appointments and reschedule for November 2019 with a lab visit for PSA and follow-up appointment.

## 2018-04-03 NOTE — Telephone Encounter (Signed)
Attempted to contact patient.  Left voicemail for him to call back.

## 2018-04-04 ENCOUNTER — Encounter: Payer: Self-pay | Admitting: General Surgery

## 2018-04-04 ENCOUNTER — Other Ambulatory Visit: Payer: Self-pay | Admitting: Urology

## 2018-05-10 ENCOUNTER — Other Ambulatory Visit: Payer: Federal, State, Local not specified - PPO

## 2018-05-12 ENCOUNTER — Ambulatory Visit: Payer: Federal, State, Local not specified - PPO | Admitting: Urology

## 2018-06-12 ENCOUNTER — Other Ambulatory Visit: Payer: Self-pay | Admitting: Family Medicine

## 2018-06-12 DIAGNOSIS — C61 Malignant neoplasm of prostate: Secondary | ICD-10-CM

## 2018-06-13 ENCOUNTER — Other Ambulatory Visit: Payer: Federal, State, Local not specified - PPO

## 2018-06-13 DIAGNOSIS — C61 Malignant neoplasm of prostate: Secondary | ICD-10-CM

## 2018-06-14 ENCOUNTER — Other Ambulatory Visit: Payer: Federal, State, Local not specified - PPO

## 2018-06-14 LAB — PSA: Prostate Specific Ag, Serum: 5.2 ng/mL — ABNORMAL HIGH (ref 0.0–4.0)

## 2018-06-16 ENCOUNTER — Ambulatory Visit (INDEPENDENT_AMBULATORY_CARE_PROVIDER_SITE_OTHER): Payer: Federal, State, Local not specified - PPO | Admitting: Urology

## 2018-06-16 ENCOUNTER — Encounter: Payer: Self-pay | Admitting: Urology

## 2018-06-16 VITALS — BP 137/71 | HR 48 | Ht 70.0 in | Wt 224.2 lb

## 2018-06-16 DIAGNOSIS — C61 Malignant neoplasm of prostate: Secondary | ICD-10-CM | POA: Diagnosis not present

## 2018-06-16 NOTE — Progress Notes (Signed)
06/16/2018 7:38 PM   [place] 1951-07-30 [id number]  Referring provider: Albina Billet, MD [address]   [place], [place] [address]  Chief Complaint  Patient presents with  . Prostate Cancer    follow up   Urologic history: 1.  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  No complaints since his last visit.  He has no bothersome lower urinary tract symptoms.  Denies dysuria or gross hematuria.  Denies flank, abdominal, pelvic or scrotal pain.  A PSA drawn on 06/13/2018 has increased to 5.2.   PMH: Past Medical History:  Diagnosis Date  . Blind left eye   . Cancer [facility]) 2019   prostate  . Glaucoma, right eye   . History of bronchitis   . History of kidney stones   . Hypertension   . Numbness and tingling in left arm     Surgical History: Past Surgical History:  Procedure Laterality Date  . ANKLE SURGERY Right   . ANKLE SURGERY    . ANTERIOR CERVICAL DECOMP/DISCECTOMY FUSION N/A 03/17/2016   Procedure: ANTERIOR CERVICAL DECOMPRESSION FUSION CERVICAL 3-4, CERVICAL 4-5, CERVICAL 5-6 WITH INSTRUMENTATION AND ALLOGRAFT;  Surgeon: Phylliss Bob, MD;  Location: [place];  Service: Orthopedics;  Laterality: N/A;  ANTERIOR CERVICAL DECOMPRESSION FUSION CERVICAL 3-4, CERVICAL 4-5, CERVICAL 5-6 WITH INSTRUMENTATION AND ALLOGRAFT  . COLONOSCOPY    . COLONOSCOPY WITH PROPOFOL N/A 03/29/2018   Procedure: COLONOSCOPY WITH PROPOFOL;  Surgeon: Robert Bellow, MD;  Location: [facility] ENDOSCOPY;  Service: Endoscopy;  Laterality: N/A;  . EYE SURGERY Left    prosthetic left eye  . EYE SURGERY  Right    cataract    Home Medications:  Allergies as of 06/16/2018      Reactions   Other    Pineapple-sick to stomach   Penicillin V Rash   Pineapple Nausea Only      Medication List        Accurate as of 06/16/18  7:38 PM. Always use your most recent med list.          amLODipine 10 MG tablet Commonly known as:  NORVASC   atenolol 50 MG tablet Commonly known as:  TENORMIN Take 50 mg by mouth 2 (two) times daily.   cholecalciferol 1000 units tablet Commonly known as:  VITAMIN D Take 1,000 Units by mouth daily.   multivitamin tablet Take 1 tablet by mouth daily.   tamsulosin 0.4 MG Caps capsule Commonly known as:  FLOMAX   timolol 0.5 % ophthalmic gel-forming Commonly known as:  TIMOPTIC-XR Place 1 drop into the right eye at bedtime.   vitamin C 100 MG tablet Take 100 mg by mouth daily.       Allergies:  Allergies  Allergen Reactions  . Other     Pineapple-sick to stomach  . Penicillin V Rash  . Pineapple Nausea Only    Family History: No family history on file.  Social History:  reports that he has never smoked. He has never used smokeless tobacco. He reports that he does not drink alcohol or use drugs.  ROS: UROLOGY Frequent Urination?: No Hard to postpone urination?:  No Burning/pain with urination?: No Get up at night to urinate?: No Leakage of urine?: No Urine stream starts and stops?: No Trouble starting stream?: No Do you have to strain to urinate?: No Blood in urine?: No Urinary tract infection?: No Sexually transmitted disease?: No Injury to kidneys or bladder?: No Painful intercourse?: No Weak stream?: No Erection problems?: No Penile pain?: No  Gastrointestinal Nausea?: No Vomiting?: No Indigestion/heartburn?: No Diarrhea?: No Constipation?: No  Constitutional Fever: No Weight loss?: No Fatigue?: No  Skin Skin rash/lesions?: No Itching?: No  Eyes Blurred vision?: No Double vision?: No  Ears/Nose/Throat Sore  throat?: No Sinus problems?: No  Hematologic/Lymphatic Swollen glands?: No Easy bruising?: No  Cardiovascular Leg swelling?: No Chest pain?: No  Respiratory Cough?: No Shortness of breath?: No  Endocrine Excessive thirst?: No  Musculoskeletal Back pain?: No Joint pain?: No  Neurological Headaches?: No Dizziness?: No  Psychologic Depression?: No Anxiety?: No  Physical Exam: BP 137/71 (BP Location: Left Arm, Patient Position: Sitting, Cuff Size: Large)   Pulse (!) 48   Ht 5\' 10"  (1.778 m)   Wt 224 lb 3.2 oz (101.7 kg)   BMI 32.17 kg/m   Constitutional:  Alert and oriented, No acute distress. HEENT: [place] AT, moist mucus membranes.  Trachea midline, no masses. Cardiovascular: No clubbing, cyanosis, or edema. Respiratory: Normal respiratory effort, no increased work of breathing. GI: Abdomen is soft, nontender, nondistended, no abdominal masses GU: No CVA tenderness.  Prostate 30 g, smooth without nodules  Skin: No rashes, bruises or suspicious lesions. Neurologic: Grossly intact, no focal deficits, moving all 4 extremities. Psychiatric: Normal mood and affect.  Assessment & Plan:   67 year old male with low risk prostate cancer.  PSA is increased slightly however recent prostate MRI and fusion biopsy confirmed low risk disease.  He desires to continue surveillance.  He will follow-up in 6 months.  Return in about 6 months (around 12/15/2018) for Recheck.   Abbie Sons, [place] [address], [place], [place] [address] [phone]

## 2018-06-19 ENCOUNTER — Encounter: Payer: Self-pay | Admitting: Urology

## 2018-06-19 DIAGNOSIS — C61 Malignant neoplasm of prostate: Secondary | ICD-10-CM | POA: Insufficient documentation

## 2018-09-22 ENCOUNTER — Other Ambulatory Visit: Payer: Federal, State, Local not specified - PPO

## 2018-09-22 ENCOUNTER — Other Ambulatory Visit: Payer: Self-pay | Admitting: Family Medicine

## 2018-09-22 ENCOUNTER — Encounter: Payer: Self-pay | Admitting: Urology

## 2018-09-22 DIAGNOSIS — C61 Malignant neoplasm of prostate: Secondary | ICD-10-CM

## 2018-09-23 LAB — PSA: Prostate Specific Ag, Serum: 6.6 ng/mL — ABNORMAL HIGH (ref 0.0–4.0)

## 2018-09-25 ENCOUNTER — Other Ambulatory Visit: Payer: Self-pay | Admitting: Family Medicine

## 2018-09-25 ENCOUNTER — Telehealth: Payer: Self-pay | Admitting: Family Medicine

## 2018-09-25 DIAGNOSIS — C61 Malignant neoplasm of prostate: Secondary | ICD-10-CM

## 2018-09-25 NOTE — Telephone Encounter (Signed)
-----   Message from Abbie Sons, MD sent at 09/25/2018  8:53 AM EST ----- Repeat PSA has increased to 6.6.  Recommend follow-up PSA and office visit May 2020

## 2018-09-25 NOTE — Telephone Encounter (Signed)
Patient notified and voiced understanding.

## 2018-12-05 ENCOUNTER — Ambulatory Visit
Admission: RE | Admit: 2018-12-05 | Discharge: 2018-12-05 | Disposition: A | Discharge status: Home / Self Care | Payer: Federal, State, Local not specified - PPO | Attending: Internal Medicine | Admitting: Internal Medicine

## 2018-12-05 ENCOUNTER — Other Ambulatory Visit: Payer: Self-pay

## 2018-12-05 ENCOUNTER — Ambulatory Visit
Admission: RE | Admit: 2018-12-05 | Discharge: 2018-12-05 | Disposition: A | Discharge status: Home / Self Care | Payer: Federal, State, Local not specified - PPO | Source: Ambulatory Visit | Attending: Internal Medicine | Admitting: Internal Medicine

## 2018-12-05 ENCOUNTER — Other Ambulatory Visit: Payer: Self-pay | Admitting: Internal Medicine

## 2018-12-05 DIAGNOSIS — M1990 Unspecified osteoarthritis, unspecified site: Secondary | ICD-10-CM

## 2018-12-19 ENCOUNTER — Other Ambulatory Visit: Payer: Self-pay

## 2018-12-19 ENCOUNTER — Other Ambulatory Visit: Payer: Federal, State, Local not specified - PPO

## 2018-12-19 DIAGNOSIS — C61 Malignant neoplasm of prostate: Secondary | ICD-10-CM

## 2018-12-20 ENCOUNTER — Telehealth: Payer: Self-pay

## 2018-12-20 ENCOUNTER — Telehealth: Payer: Self-pay | Admitting: Urology

## 2018-12-20 LAB — PSA: Prostate Specific Ag, Serum: 5.9 ng/mL — ABNORMAL HIGH (ref 0.0–4.0)

## 2018-12-20 NOTE — Telephone Encounter (Signed)
-----   Message from Abbie Sons, MD sent at 12/20/2018  3:30 PM EDT ----- PSA was 5.9.  Please schedule office visit for DRE next month

## 2018-12-20 NOTE — Telephone Encounter (Signed)
Pt. Returning missed call. I gave him PSA level and Follow up appointment.

## 2018-12-20 NOTE — Telephone Encounter (Signed)
Called pt, no answer. LM for pt informing him of the information below. Advised pt to call office back to schedule appt.

## 2018-12-21 ENCOUNTER — Other Ambulatory Visit: Payer: Self-pay

## 2018-12-21 DIAGNOSIS — R700 Elevated erythrocyte sedimentation rate: Secondary | ICD-10-CM | POA: Insufficient documentation

## 2018-12-21 DIAGNOSIS — M1990 Unspecified osteoarthritis, unspecified site: Secondary | ICD-10-CM | POA: Insufficient documentation

## 2018-12-26 ENCOUNTER — Other Ambulatory Visit: Payer: Self-pay

## 2018-12-26 MED ORDER — TAMSULOSIN HCL 0.4 MG PO CAPS: 0.4000 mg | ORAL_CAPSULE | Freq: Every day | ORAL | 6 refills | Status: DC

## 2019-01-02 ENCOUNTER — Other Ambulatory Visit: Payer: Self-pay

## 2019-01-02 DIAGNOSIS — M2550 Pain in unspecified joint: Secondary | ICD-10-CM | POA: Insufficient documentation

## 2019-01-02 DIAGNOSIS — C61 Malignant neoplasm of prostate: Secondary | ICD-10-CM

## 2019-01-02 DIAGNOSIS — R200 Anesthesia of skin: Secondary | ICD-10-CM | POA: Insufficient documentation

## 2019-01-02 MED ORDER — TAMSULOSIN HCL 0.4 MG PO CAPS: 0.4000 mg | ORAL_CAPSULE | Freq: Every day | ORAL | 6 refills | Status: DC

## 2019-01-03 ENCOUNTER — Telehealth: Payer: Self-pay | Admitting: Urology

## 2019-01-03 NOTE — Telephone Encounter (Signed)
Pt needs refill for Flomax sent to [facility].

## 2019-01-03 NOTE — Telephone Encounter (Signed)
Called pt informed him that RX has been sent in and pharmacy has been changed.

## 2019-01-09 ENCOUNTER — Telehealth: Payer: Self-pay | Admitting: Urology

## 2019-01-09 NOTE — Telephone Encounter (Signed)
Pt has appt for office visit DRE on 6/12, which needs to be rescheduled, since [facility] won't be in the office that day.  He wanted to come on 6/11, but no openings.  Pt doesn't want to wait until July.  Please advise.

## 2019-01-10 NOTE — Telephone Encounter (Signed)
I moved him to the 11th but didn't realize he wanted afternoon so I told him to keep the 9:30 for now and once I get [place]'s schedule I will move him again cause he can't do the 9:30. lol   Sharyn Lull

## 2019-01-18 ENCOUNTER — Ambulatory Visit: Payer: Federal, State, Local not specified - PPO | Admitting: Urology

## 2019-01-18 ENCOUNTER — Other Ambulatory Visit: Payer: Self-pay

## 2019-01-18 ENCOUNTER — Encounter: Payer: Self-pay | Admitting: Urology

## 2019-01-18 VITALS — BP 125/67 | HR 62 | Ht 70.0 in | Wt 212.7 lb

## 2019-01-18 DIAGNOSIS — C61 Malignant neoplasm of prostate: Secondary | ICD-10-CM | POA: Diagnosis not present

## 2019-01-18 NOTE — Progress Notes (Signed)
01/18/2019 1:11 PM   [place] 07-16-1951 [id number]  Referring provider: Albina Billet, MD [address]   [place],  [place] [address]  Chief Complaint  Patient presents with  . Prostate Cancer   Urologic history: 1.  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  No complaints since his last visit.  He has no bothersome lower urinary tract symptoms.  Denies dysuria or gross hematuria.  Denies flank, abdominal, pelvic or scrotal pain.  A PSA drawn on 06/13/2018 had increased to 5.2.  His PSA was repeated on 09/22/2018 and increased to 6.6.  A 62-month follow-up was recommended and his PSA repeated on 12/19/2018 was 5.9.   PMH: Past Medical History:  Diagnosis Date  . Blind left eye   . Cancer [facility]) 2019   prostate  . Glaucoma, right eye   . History of bronchitis   . History of kidney stones   . Hypertension   . Numbness and tingling in left arm     Surgical History: Past Surgical History:  Procedure Laterality Date  . ANKLE SURGERY Right   . ANKLE SURGERY    . ANTERIOR CERVICAL DECOMP/DISCECTOMY FUSION N/A 03/17/2016   Procedure: ANTERIOR CERVICAL DECOMPRESSION FUSION CERVICAL 3-4, CERVICAL 4-5, CERVICAL 5-6 WITH INSTRUMENTATION AND ALLOGRAFT;  Surgeon: Phylliss Bob, MD;  Location: [place];  Service: Orthopedics;  Laterality: N/A;  ANTERIOR CERVICAL DECOMPRESSION FUSION CERVICAL 3-4, CERVICAL 4-5, CERVICAL 5-6 WITH INSTRUMENTATION AND ALLOGRAFT  . COLONOSCOPY    . COLONOSCOPY WITH PROPOFOL N/A 03/29/2018   Procedure: COLONOSCOPY WITH PROPOFOL;  Surgeon: Robert Bellow, MD;   Location: [facility] ENDOSCOPY;  Service: Endoscopy;  Laterality: N/A;  . EYE SURGERY Left    prosthetic left eye  . EYE SURGERY Right    cataract    Home Medications:  Allergies as of 01/18/2019      Reactions   Other    Pineapple-sick to stomach   Penicillin V Rash   Pineapple Nausea Only      Medication List       Accurate as of January 18, 2019  1:11 PM. If you have any questions, ask your nurse or doctor.        amLODipine 10 MG tablet Commonly known as: NORVASC   atenolol 50 MG tablet Commonly known as: TENORMIN Take 50 mg by mouth 2 (two) times daily.   cholecalciferol 1000 units tablet Commonly known as: VITAMIN D Take 1,000 Units by mouth daily.   multivitamin tablet Take 1 tablet by mouth daily.   tamsulosin 0.4 MG Caps capsule Commonly known as: FLOMAX Take 1 capsule (0.4 mg total) by mouth daily.   timolol 0.5 % ophthalmic gel-forming Commonly known as: TIMOPTIC-XR Place 1 drop into the right eye at bedtime.   vitamin C 100 MG tablet Take 100 mg by mouth daily.       Allergies:  Allergies  Allergen Reactions  . Other     Pineapple-sick to stomach  . Penicillin V Rash  . Pineapple Nausea Only    Family History: History reviewed. No pertinent family history.  Social History:  reports that he has never  smoked. He has never used smokeless tobacco. He reports that he does not drink alcohol or use drugs.  ROS: UROLOGY Frequent Urination?: No Hard to postpone urination?: No Burning/pain with urination?: No Get up at night to urinate?: No Leakage of urine?: No Urine stream starts and stops?: No Trouble starting stream?: No Do you have to strain to urinate?: No Blood in urine?: No Urinary tract infection?: No Sexually transmitted disease?: No Injury to kidneys or bladder?: No Painful intercourse?: No Weak stream?: No Erection problems?: No Penile pain?: No  Gastrointestinal Nausea?: No Vomiting?: No Indigestion/heartburn?: No Diarrhea?:  No Constipation?: No  Constitutional Fever: No Night sweats?: No Weight loss?: No Fatigue?: No  Skin Skin rash/lesions?: No Itching?: No  Eyes Blurred vision?: No Double vision?: No  Ears/Nose/Throat Sore throat?: No Sinus problems?: No  Hematologic/Lymphatic Swollen glands?: No Easy bruising?: No  Cardiovascular Leg swelling?: No Chest pain?: No  Respiratory Cough?: No Shortness of breath?: No  Endocrine Excessive thirst?: No  Musculoskeletal Back pain?: No Joint pain?: No  Neurological Headaches?: No Dizziness?: No  Psychologic Depression?: No Anxiety?: No  Physical Exam: BP 125/67 (BP Location: Left Arm, Patient Position: Sitting, Cuff Size: Normal)   Pulse 62   Ht 5\' 10"  (1.778 m)   Wt 212 lb 11.2 oz (96.5 kg)   BMI 30.52 kg/m   Constitutional:  Alert and oriented, No acute distress. HEENT: [place] AT, moist mucus membranes.  Trachea midline, no masses. Cardiovascular: No clubbing, cyanosis, or edema. Respiratory: Normal respiratory effort, no increased work of breathing. GI: Abdomen is soft, nontender, nondistended, no abdominal masses GU: No CVA tenderness.  Prostate 30 g, smooth without nodules Lymph: No cervical or inguinal lymphadenopathy. Skin: No rashes, bruises or suspicious lesions. Neurologic: Grossly intact, no focal deficits, moving all 4 extremities. Psychiatric: Normal mood and affect.   Assessment & Plan:   68 year old male with low risk prostate cancer on active surveillance however his PSA has risen abnormally from baseline.  We discussed options of repeat prostate MRI versus continued surveillance.  I have recommended a follow-up MRI and he is in agreement.  Abbie Sons, [place] [address] [place], [place] [address] [phone]

## 2019-01-19 ENCOUNTER — Ambulatory Visit: Payer: Federal, State, Local not specified - PPO | Admitting: Urology

## 2019-02-08 ENCOUNTER — Ambulatory Visit: Payer: Federal, State, Local not specified - PPO | Admitting: Urology

## 2019-02-15 ENCOUNTER — Other Ambulatory Visit: Payer: Self-pay

## 2019-02-15 ENCOUNTER — Ambulatory Visit
Admission: RE | Admit: 2019-02-15 | Discharge: 2019-02-15 | Disposition: A | Discharge status: Home / Self Care | Payer: Federal, State, Local not specified - PPO | Source: Ambulatory Visit | Attending: Urology | Admitting: Urology

## 2019-02-15 DIAGNOSIS — C61 Malignant neoplasm of prostate: Secondary | ICD-10-CM | POA: Diagnosis present

## 2019-02-15 LAB — POCT I-STAT CREATININE: Creatinine, Ser: 0.8 mg/dL (ref 0.61–1.24)

## 2019-02-15 MED ORDER — GADOBUTROL 1 MMOL/ML IV SOLN
10.0000 mL | Freq: Once | INTRAVENOUS | Status: AC | PRN
  Administered 2019-02-15: 10 mL via INTRAVENOUS

## 2019-02-23 ENCOUNTER — Telehealth: Payer: Self-pay | Admitting: Urology

## 2019-02-23 DIAGNOSIS — R972 Elevated prostate specific antigen [PSA]: Secondary | ICD-10-CM

## 2019-02-23 DIAGNOSIS — R935 Abnormal findings on diagnostic imaging of other abdominal regions, including retroperitoneum: Secondary | ICD-10-CM

## 2019-02-23 NOTE — Telephone Encounter (Signed)
Pt does not appear to have a f/u visit scheduled to discuss results. They were released to his mychart. Please advise

## 2019-02-23 NOTE — Telephone Encounter (Signed)
Pt called and states that he got his MRI results via MyChart and would like to discuss them with Dr Bernardo Heater. Please advise.

## 2019-02-28 NOTE — Telephone Encounter (Signed)
PI-RADS 5 lesion on prostate MRI.  I recommended scheduling a fusion biopsy.  The patient was notified by office staff.  Referral order entered.

## 2019-02-28 NOTE — Addendum Note (Signed)
Addended by: Abbie Sons on: 02/28/2019 07:42 AM   Modules accepted: Orders

## 2019-03-12 ENCOUNTER — Other Ambulatory Visit: Payer: Self-pay | Admitting: Urology

## 2019-03-13 ENCOUNTER — Encounter: Payer: Self-pay | Admitting: Urology

## 2019-03-13 ENCOUNTER — Other Ambulatory Visit: Payer: Self-pay | Admitting: Urology

## 2019-03-13 ENCOUNTER — Other Ambulatory Visit: Payer: Self-pay

## 2019-03-13 ENCOUNTER — Ambulatory Visit: Payer: Federal, State, Local not specified - PPO | Admitting: Urology

## 2019-03-13 VITALS — BP 128/74 | HR 58 | Ht 70.0 in | Wt 210.8 lb

## 2019-03-13 DIAGNOSIS — C61 Malignant neoplasm of prostate: Secondary | ICD-10-CM

## 2019-03-14 NOTE — Progress Notes (Signed)
03/13/2019 7:10 AM   [place] July 22, 1951 [id number]  Referring provider: Albina Billet, MD [address]   [place],  [place] [address]  Chief Complaint  Patient presents with  . Results    BX results     Urologic history: 1. 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  A PSA 09/2018 and increased to 6.6 and a repeat PSA remained elevated above baseline at 5.9.  Follow-up prostate MRI performed on 02/15/2019 showed a 27 g prostate with a PI-RADS 5 lesion in the left peripheral zone.  A focal capsular bulge was seen in the left anterior mid gland suspicious for extracapsular extension.  Repeat fusion biopsy was performed in [place] on 03/08/2019.  He had no post biopsy complaints.  ROI biopsy showed Gleason 3+3 adenocarcinoma 3/3 cores involving 30, 20 and 10% of the submitted tissue.  Additional 3+3 adenocarcinoma was noted in 1/4 cores of the left apex involving 5% of the submitted tissue.   PMH: Past Medical History:  Diagnosis Date  . Blind left eye   . Cancer [facility]) 2019   prostate  . Glaucoma, right eye   . History of bronchitis   . History of kidney stones   . Hypertension   . Numbness and tingling in left arm     Surgical History: Past Surgical History:  Procedure Laterality Date  . ANKLE SURGERY Right   . ANKLE SURGERY    . ANTERIOR CERVICAL DECOMP/DISCECTOMY FUSION N/A 03/17/2016   Procedure: ANTERIOR CERVICAL DECOMPRESSION FUSION CERVICAL 3-4, CERVICAL 4-5, CERVICAL 5-6 WITH INSTRUMENTATION AND ALLOGRAFT;  Surgeon: Phylliss Bob, MD;  Location: [place];  Service:  Orthopedics;  Laterality: N/A;  ANTERIOR CERVICAL DECOMPRESSION FUSION CERVICAL 3-4, CERVICAL 4-5, CERVICAL 5-6 WITH INSTRUMENTATION AND ALLOGRAFT  . COLONOSCOPY    . COLONOSCOPY WITH PROPOFOL N/A 03/29/2018   Procedure: COLONOSCOPY WITH PROPOFOL;  Surgeon: Robert Bellow, MD;  Location: [facility];  Service: Endoscopy;  Laterality: N/A;  . EYE SURGERY Left    prosthetic left eye  . EYE SURGERY Right    cataract    Home Medications:  Allergies as of 03/13/2019      Reactions   Other    Pineapple-sick to stomach   Penicillin V Rash   Pineapple Nausea Only      Medication List       Accurate as of March 13, 2019 11:59 PM. If you have any questions, ask your nurse or doctor.        amLODipine 10 MG tablet Commonly known as: NORVASC   atenolol 50 MG tablet Commonly known as: TENORMIN Take 50 mg by mouth 2 (two) times daily.   cholecalciferol 1000 units tablet Commonly known as: VITAMIN D Take 1,000 Units by mouth daily.   meloxicam 15 MG tablet Commonly known as: MOBIC   multivitamin tablet Take 1 tablet by mouth daily.   tamsulosin 0.4 MG Caps capsule Commonly known as: FLOMAX Take 1 capsule (0.4 mg total) by mouth daily.   timolol 0.5 % ophthalmic gel-forming Commonly known as: TIMOPTIC-XR Place 1 drop into the right eye at bedtime.   vitamin C 100 MG tablet Take  100 mg by mouth daily.       Allergies:  Allergies  Allergen Reactions  . Other     Pineapple-sick to stomach  . Penicillin V Rash  . Pineapple Nausea Only    Family History: No family history on file.  Social History:  reports that he has never smoked. He has never used smokeless tobacco. He reports that he does not drink alcohol or use drugs.  ROS: UROLOGY Frequent Urination?: No Hard to postpone urination?: No Burning/pain with urination?: No Get up at night to urinate?: No Leakage of urine?: No Urine stream starts and stops?: No Trouble starting stream?: No Do you have to  strain to urinate?: No Blood in urine?: No Urinary tract infection?: No Sexually transmitted disease?: No Injury to kidneys or bladder?: No Painful intercourse?: No Weak stream?: No Erection problems?: No Penile pain?: No  Gastrointestinal Nausea?: No Vomiting?: No Indigestion/heartburn?: No Diarrhea?: No Constipation?: No  Constitutional Fever: No Night sweats?: No Weight loss?: No Fatigue?: No  Skin Skin rash/lesions?: No Itching?: No  Eyes Blurred vision?: No Double vision?: No  Ears/Nose/Throat Sore throat?: No Sinus problems?: No  Hematologic/Lymphatic Swollen glands?: No Easy bruising?: No  Cardiovascular Leg swelling?: No Chest pain?: No  Respiratory Cough?: No Shortness of breath?: No  Endocrine Excessive thirst?: No  Musculoskeletal Back pain?: No Joint pain?: No  Neurological Headaches?: No Dizziness?: No  Psychologic Depression?: No Anxiety?: No  Physical Exam: BP 128/74 (BP Location: Left Arm, Patient Position: Sitting, Cuff Size: Normal)   Pulse (!) 58   Ht 5\' 10"  (1.778 m)   Wt 210 lb 12.8 oz (95.6 kg)   BMI 30.25 kg/m   Constitutional:  Alert and oriented, No acute distress. HEENT: [place] AT, moist mucus membranes.  Trachea midline, no masses. Cardiovascular: No clubbing, cyanosis, or edema. Respiratory: Normal respiratory effort, no increased work of breathing. Skin: No rashes, bruises or suspicious lesions. Neurologic: Grossly intact, no focal deficits, moving all 4 extremities. Psychiatric: Normal mood and affect.   Assessment & Plan:   3 biopsies have shown Gleason 3+3 adenocarcinoma with the last 2 being fusion biopsies of PI-RADS 4/5 lesions.  He does have capsular bulging suspicious for extraprostatic extension.  Based on this finding he may want to consider focal curative treatment.  We discussed both radical prostatectomy and radiation therapy.  He would be more interested in radiation therapy.  I have recommended an  appointment in radiation oncology for further discussion.  He desires to pursue and referral was sent.   Manuel Patel, [facility] [address], [place], [place] [address] [phone]

## 2019-03-15 ENCOUNTER — Telehealth: Payer: Self-pay | Admitting: Urology

## 2019-03-15 DIAGNOSIS — C61 Malignant neoplasm of prostate: Secondary | ICD-10-CM

## 2019-03-15 NOTE — Telephone Encounter (Signed)
Pt called asking about his fusion biopsy results. Please advise on pt cell phone. Thanks.

## 2019-03-16 NOTE — Telephone Encounter (Signed)
I contacted Manuel Patel regarding biopsy results.  The PI-RADS 5 lesion was positive for Gleason 3+3 adenocarcinoma.  Since MRI did show capsular bulging I would recommend pursuing therapy for curative intent.  He is not interested in radical prostatectomy and will place referral to radiation oncology.

## 2019-03-17 ENCOUNTER — Encounter: Payer: Self-pay | Admitting: Urology

## 2019-03-20 ENCOUNTER — Other Ambulatory Visit: Payer: Self-pay

## 2019-03-20 ENCOUNTER — Ambulatory Visit
Admission: RE | Admit: 2019-03-20 | Discharge: 2019-03-20 | Disposition: A | Discharge status: Home / Self Care | Payer: Federal, State, Local not specified - PPO | Source: Ambulatory Visit | Attending: Radiation Oncology | Admitting: Radiation Oncology

## 2019-03-20 ENCOUNTER — Encounter: Payer: Self-pay | Admitting: Radiation Oncology

## 2019-03-20 VITALS — BP 134/71 | HR 52 | Temp 98.2°F | Resp 16 | Wt 212.8 lb

## 2019-03-20 DIAGNOSIS — I10 Essential (primary) hypertension: Secondary | ICD-10-CM | POA: Insufficient documentation

## 2019-03-20 DIAGNOSIS — N200 Calculus of kidney: Secondary | ICD-10-CM | POA: Diagnosis not present

## 2019-03-20 DIAGNOSIS — Z87442 Personal history of urinary calculi: Secondary | ICD-10-CM | POA: Diagnosis not present

## 2019-03-20 DIAGNOSIS — Z79899 Other long term (current) drug therapy: Secondary | ICD-10-CM | POA: Insufficient documentation

## 2019-03-20 DIAGNOSIS — C61 Malignant neoplasm of prostate: Secondary | ICD-10-CM

## 2019-03-20 NOTE — Consult Note (Signed)
NEW PATIENT EVALUATION  Name: Manuel Patel  MRN: [id number]  Date:   03/20/2019     DOB: 1950/12/08   This 68 y.o. male patient presents to the clinic for initial evaluation of stage stage I (T1 cN0 M0) Gleason 6 (3+3) adenocarcinoma the prostate presenting with a PSA of 6.  REFERRING PHYSICIAN: Albina Billet, MD  CHIEF COMPLAINT:  Chief Complaint  Patient presents with  . Prostate Cancer    Initial consultation    DIAGNOSIS: The encounter diagnosis was Prostate cancer ([name]).   PREVIOUS INVESTIGATIONS:  MRI of prostate reviewed Clinical notes reviewed Pathology report reviewed  HPI: Patient is a 68 year old male under active surveillance for the past 2 years after biopsy in 2018 was positive with 2 of 12 cores Gleason 6 (3+3 adenocarcinoma when he presented with a slightly elevated PSA of 4.2.  More recently his PSA has jumped to approximately 6 he underwent a fusion biopsy back in 2019 1 year prior again showing Gleason 6 (3+3) biopsy and from the left anterior apex.  PSA more recently has slightly elevated to 6 a follow-up prostate MRI performed in July 2020 showed a 27 g prostate with a PI-RADS 5 lesion in the left peripheral zone and focal capsular bulge concerning for extracapsular extension.  He is been seen by urology and recommendation to proceed with treatment was made P patient has refused surgical resection.  He is fairly asymptomatic specifically denies urgency frequency or nocturia.  PLANNED TREATMENT REGIMEN: I-125 interstitial implant  PAST MEDICAL HISTORY:  has a past medical history of Blind left eye, Cancer ([place]) (2019), Glaucoma, right eye, History of bronchitis, History of kidney stones, Hypertension, and Numbness and tingling in left arm.    PAST SURGICAL HISTORY:  Past Surgical History:  Procedure Laterality Date  . ANKLE SURGERY Right   . ANKLE SURGERY    . ANTERIOR CERVICAL DECOMP/DISCECTOMY FUSION N/A 03/17/2016   Procedure: ANTERIOR CERVICAL DECOMPRESSION  FUSION CERVICAL 3-4, CERVICAL 4-5, CERVICAL 5-6 WITH INSTRUMENTATION AND ALLOGRAFT;  Surgeon: Phylliss Bob, MD;  Location: [facility];  Service: Orthopedics;  Laterality: N/A;  ANTERIOR CERVICAL DECOMPRESSION FUSION CERVICAL 3-4, CERVICAL 4-5, CERVICAL 5-6 WITH INSTRUMENTATION AND ALLOGRAFT  . COLONOSCOPY    . COLONOSCOPY WITH PROPOFOL N/A 03/29/2018   Procedure: COLONOSCOPY WITH PROPOFOL;  Surgeon: Robert Bellow, MD;  Location: [facility];  Service: Endoscopy;  Laterality: N/A;  . EYE SURGERY Left    prosthetic left eye  . EYE SURGERY Right    cataract    FAMILY HISTORY: family history is not on file.  SOCIAL HISTORY:  reports that he has never smoked. He has never used smokeless tobacco. He reports that he does not drink alcohol or use drugs.  ALLERGIES: Other, Penicillin v, and Pineapple  MEDICATIONS:  Current Outpatient Medications  Medication Sig Dispense Refill  . amLODipine (NORVASC) 10 MG tablet     . Ascorbic Acid (VITAMIN C) 100 MG tablet Take 100 mg by mouth daily.    Marland Kitchen atenolol (TENORMIN) 50 MG tablet Take 50 mg by mouth 2 (two) times daily.  0  . cholecalciferol (VITAMIN D) 1000 units tablet Take 1,000 Units by mouth daily.    . meloxicam (MOBIC) 15 MG tablet     . Multiple Vitamin (MULTIVITAMIN) tablet Take 1 tablet by mouth daily.    . tamsulosin (FLOMAX) 0.4 MG CAPS capsule Take 1 capsule (0.4 mg total) by mouth daily. 30 capsule 6  . timolol (TIMOPTIC-XR) 0.5 % ophthalmic gel-forming Place  1 drop into the right eye at bedtime.  0   No current facility-administered medications for this encounter.     ECOG PERFORMANCE STATUS:  0 - Asymptomatic  REVIEW OF SYSTEMS: Patient denies any weight loss, fatigue, weakness, fever, chills or night sweats. Patient denies any loss of vision, blurred vision. Patient denies any ringing  of the ears or hearing loss. No irregular heartbeat. Patient denies heart murmur or history of fainting. Patient denies any chest pain or pain  radiating to her upper extremities. Patient denies any shortness of breath, difficulty breathing at night, cough or hemoptysis. Patient denies any swelling in the lower legs. Patient denies any nausea vomiting, vomiting of blood, or coffee ground material in the vomitus. Patient denies any stomach pain. Patient states has had normal bowel movements no significant constipation or diarrhea. Patient denies any dysuria, hematuria or significant nocturia. Patient denies any problems walking, swelling in the joints or loss of balance. Patient denies any skin changes, loss of hair or loss of weight. Patient denies any excessive worrying or anxiety or significant depression. Patient denies any problems with insomnia. Patient denies excessive thirst, polyuria, polydipsia. Patient denies any swollen glands, patient denies easy bruising or easy bleeding. Patient denies any recent infections, allergies or URI. Patient "s visual fields have not changed significantly in recent time.   PHYSICAL EXAM: BP 134/71 (BP Location: Left Arm, Patient Position: Sitting)   Pulse (!) 52   Temp 98.2 F (36.8 C) (Tympanic)   Resp 16   Wt 212 lb 12.8 oz (96.5 kg)   BMI 30.53 kg/m  Well-developed well-nourished patient in NAD. HEENT reveals PERLA, EOMI, discs not visualized.  Oral cavity is clear. No oral mucosal lesions are identified. Neck is clear without evidence of cervical or supraclavicular adenopathy. Lungs are clear to A&P. Cardiac examination is essentially unremarkable with regular rate and rhythm without murmur rub or thrill. Abdomen is benign with no organomegaly or masses noted. Motor sensory and DTR levels are equal and symmetric in the upper and lower extremities. Cranial nerves II through XII are grossly intact. Proprioception is intact. No peripheral adenopathy or edema is identified. No motor or sensory levels are noted. Crude visual fields are within normal range.  LABORATORY DATA: Pathology report reviewed     RADIOLOGY RESULTS: MRI of prostate reviewed compatible with above-stated findings   IMPRESSION: Stage I Gleason 6 adenocarcinoma the prostate with a PSA of 51 in 68 year old male with extracapsular bulge may represent an early extracapsular extension of his prostate cancer.  PLAN: At this time I agree with Dr. Bernardo Heater we should proceed with definitive treatment at this time based on his young age excellent overall condition.  In lieu of of robotic prostatectomy I have recommended I-125 interstitial implant full implant.  Risks and benefits of treatment including increased lower urinary tract symptoms including urgency frequency nocturia as well as possible diarrhea and fatigue all were discussed in detail I also reviewed radiation safety precautions.  Patient comprehends my treatment plan well.  He is having carpal tunnel surgery this month and we will schedule his volume study for sometime in September.  Patient comprehends my treatment plan well.  I would like to take this opportunity to thank you for allowing me to participate in the care of your patient.Noreene Filbert, MD

## 2019-05-18 ENCOUNTER — Other Ambulatory Visit: Payer: Self-pay

## 2019-05-18 ENCOUNTER — Other Ambulatory Visit
Admission: RE | Admit: 2019-05-18 | Discharge: 2019-05-18 | Disposition: A | Discharge status: Home / Self Care | Payer: Federal, State, Local not specified - PPO | Source: Ambulatory Visit | Attending: Radiation Oncology | Admitting: Radiation Oncology

## 2019-05-18 DIAGNOSIS — Z01812 Encounter for preprocedural laboratory examination: Secondary | ICD-10-CM | POA: Diagnosis present

## 2019-05-18 DIAGNOSIS — Z20828 Contact with and (suspected) exposure to other viral communicable diseases: Secondary | ICD-10-CM | POA: Diagnosis not present

## 2019-05-18 LAB — SARS CORONAVIRUS 2 (TAT 6-24 HRS): SARS Coronavirus 2: NEGATIVE

## 2019-05-22 ENCOUNTER — Encounter: Payer: Self-pay | Admitting: Anesthesiology

## 2019-05-22 ENCOUNTER — Ambulatory Visit: Payer: Federal, State, Local not specified - PPO | Admitting: Radiation Oncology

## 2019-05-22 ENCOUNTER — Other Ambulatory Visit: Payer: Self-pay | Admitting: Radiology

## 2019-05-22 DIAGNOSIS — C61 Malignant neoplasm of prostate: Secondary | ICD-10-CM

## 2019-05-22 MED ORDER — CIPROFLOXACIN IN D5W 400 MG/200ML IV SOLN: 400.0000 mg | INTRAVENOUS | Status: DC

## 2019-05-23 ENCOUNTER — Ambulatory Visit
Admission: RE | Admit: 2019-05-23 | Discharge: 2019-05-23 | Disposition: A | Discharge status: Home / Self Care | Payer: Federal, State, Local not specified - PPO | Source: Ambulatory Visit | Attending: Radiation Oncology | Admitting: Radiation Oncology

## 2019-05-23 ENCOUNTER — Ambulatory Visit
Admission: RE | Admit: 2019-05-23 | Disposition: A | Discharge status: Home / Self Care | Payer: Federal, State, Local not specified - PPO | Source: Home / Self Care

## 2019-05-23 ENCOUNTER — Other Ambulatory Visit: Payer: Self-pay

## 2019-05-23 ENCOUNTER — Ambulatory Visit
Admission: RE | Admit: 2019-05-23 | Discharge: 2019-05-23 | Disposition: A | Discharge status: Home / Self Care | Payer: Federal, State, Local not specified - PPO | Attending: Radiation Oncology | Admitting: Radiation Oncology

## 2019-05-23 ENCOUNTER — Encounter: Payer: Self-pay | Admitting: Radiation Oncology

## 2019-05-23 VITALS — BP 122/58 | HR 48 | Temp 97.1°F | Resp 16 | Wt 212.8 lb

## 2019-05-23 DIAGNOSIS — H5462 Unqualified visual loss, left eye, normal vision right eye: Secondary | ICD-10-CM | POA: Insufficient documentation

## 2019-05-23 DIAGNOSIS — Z981 Arthrodesis status: Secondary | ICD-10-CM | POA: Diagnosis not present

## 2019-05-23 DIAGNOSIS — Z923 Personal history of irradiation: Secondary | ICD-10-CM | POA: Insufficient documentation

## 2019-05-23 DIAGNOSIS — Z7951 Long term (current) use of inhaled steroids: Secondary | ICD-10-CM | POA: Insufficient documentation

## 2019-05-23 DIAGNOSIS — C61 Malignant neoplasm of prostate: Secondary | ICD-10-CM | POA: Insufficient documentation

## 2019-05-23 DIAGNOSIS — I10 Essential (primary) hypertension: Secondary | ICD-10-CM | POA: Insufficient documentation

## 2019-05-23 DIAGNOSIS — Z79899 Other long term (current) drug therapy: Secondary | ICD-10-CM | POA: Diagnosis not present

## 2019-05-23 SURGERY — PROSTATE VOLUME STUDIES
Anesthesia: Choice

## 2019-05-23 NOTE — H&P (View-Only) (Signed)
History and physical  Name: Manuel Patel  MRN: [id number]  Date:   05/23/2019     DOB: 1951-04-30   This 68 y.o. male patient presents to the clinic for history and physical in preparation for I-125 interstitial implant for adenocarcinoma of the prostate stage I  REFERRING PHYSICIAN: Albina Billet, MD  CHIEF COMPLAINT:  Chief Complaint  Patient presents with  . Prostate Cancer    Post volume study    DIAGNOSIS: The encounter diagnosis was Prostate cancer ([place]).   PREVIOUS INVESTIGATIONS:  Clinical notes reviewed Pathology report reviewed  HPI: Patient is a 68 year old male under active surveillance for the past 2 years after biopsy in 2018 was positive with 2 of 12 cores for Gleason 6 (3+3) adenocarcinoma.  His PSA did jump to 6 recently underwent fusion biopsy again showing Gleason 6 adenocarcinoma.  Patient refused surgical resection he is seen today for volume study anticipation of I-125 interstitial implant with curative intent.  He is doing well no increased lower urinary tract symptoms at this time.  PLANNED TREATMENT REGIMEN: I-125 interstitial implant  PAST MEDICAL HISTORY:  has a past medical history of Blind left eye, Cancer ([place]) (2019), Glaucoma, right eye, History of bronchitis, History of kidney stones, Hypertension, and Numbness and tingling in left arm.    PAST SURGICAL HISTORY:  Past Surgical History:  Procedure Laterality Date  . ANKLE SURGERY Right   . ANKLE SURGERY    . ANTERIOR CERVICAL DECOMP/DISCECTOMY FUSION N/A 03/17/2016   Procedure: ANTERIOR CERVICAL DECOMPRESSION FUSION CERVICAL 3-4, CERVICAL 4-5, CERVICAL 5-6 WITH INSTRUMENTATION AND ALLOGRAFT;  Surgeon: Phylliss Bob, MD;  Location: [place];  Service: Orthopedics;  Laterality: N/A;  ANTERIOR CERVICAL DECOMPRESSION FUSION CERVICAL 3-4, CERVICAL 4-5, CERVICAL 5-6 WITH INSTRUMENTATION AND ALLOGRAFT  . COLONOSCOPY    . COLONOSCOPY WITH PROPOFOL N/A 03/29/2018   Procedure: COLONOSCOPY WITH PROPOFOL;   Surgeon: Robert Bellow, MD;  Location: [facility];  Service: Endoscopy;  Laterality: N/A;  . EYE SURGERY Left    prosthetic left eye  . EYE SURGERY Right    cataract    FAMILY HISTORY: family history is not on file.  SOCIAL HISTORY:  reports that he has never smoked. He has never used smokeless tobacco. He reports that he does not drink alcohol or use drugs.  ALLERGIES: Penicillin v and Pineapple  MEDICATIONS:  Current Outpatient Medications  Medication Sig Dispense Refill  . amLODipine (NORVASC) 10 MG tablet Take 10 mg by mouth daily.     Marland Kitchen atenolol (TENORMIN) 50 MG tablet Take 50 mg by mouth 2 (two) times daily.  0  . fluticasone (FLONASE) 50 MCG/ACT nasal spray Place 1 spray into both nostrils daily as needed for allergies or rhinitis.    Marland Kitchen ibuprofen (ADVIL) 200 MG tablet Take 400 mg by mouth daily as needed for moderate pain.    . Multiple Vitamin (MULTIVITAMIN) tablet Take 1 tablet by mouth 3 (three) times a week.     . tamsulosin (FLOMAX) 0.4 MG CAPS capsule Take 1 capsule (0.4 mg total) by mouth daily. (Patient taking differently: Take 0.4 mg by mouth at bedtime. ) 30 capsule 6  . timolol (TIMOPTIC-XR) 0.5 % ophthalmic gel-forming Place 1 drop into the right eye at bedtime.  0   No current facility-administered medications for this encounter.    Facility-Administered Medications Ordered in Other Encounters  Medication Dose Route Frequency Provider Last Rate Last Dose  . ciprofloxacin (CIPRO) IVPB 400 mg  400 mg Intravenous 60 min  Pre-Op Stoioff, Ronda Fairly, MD        ECOG PERFORMANCE STATUS:  0 - Asymptomatic  REVIEW OF SYSTEMS: Patient denies any weight loss, fatigue, weakness, fever, chills or night sweats. Patient denies any loss of vision, blurred vision. Patient denies any ringing  of the ears or hearing loss. No irregular heartbeat. Patient denies heart murmur or history of fainting. Patient denies any chest pain or pain radiating to her upper extremities. Patient  denies any shortness of breath, difficulty breathing at night, cough or hemoptysis. Patient denies any swelling in the lower legs. Patient denies any nausea vomiting, vomiting of blood, or coffee ground material in the vomitus. Patient denies any stomach pain. Patient states has had normal bowel movements no significant constipation or diarrhea. Patient denies any dysuria, hematuria or significant nocturia. Patient denies any problems walking, swelling in the joints or loss of balance. Patient denies any skin changes, loss of hair or loss of weight. Patient denies any excessive worrying or anxiety or significant depression. Patient denies any problems with insomnia. Patient denies excessive thirst, polyuria, polydipsia. Patient denies any swollen glands, patient denies easy bruising or easy bleeding. Patient denies any recent infections, allergies or URI. Patient "s visual fields have not changed significantly in recent time.   PHYSICAL EXAM: BP (!) 122/58 (BP Location: Left Arm, Patient Position: Sitting)   Pulse (!) 48   Temp (!) 97.1 F (36.2 C) (Tympanic)   Resp 16   Wt 212 lb 12.8 oz (96.5 kg)   BMI 30.53 kg/m  Well-developed well-nourished patient in NAD. HEENT reveals PERLA, EOMI, discs not visualized.  Oral cavity is clear. No oral mucosal lesions are identified. Neck is clear without evidence of cervical or supraclavicular adenopathy. Lungs are clear to A&P. Cardiac examination is essentially unremarkable with regular rate and rhythm without murmur rub or thrill. Abdomen is benign with no organomegaly or masses noted. Motor sensory and DTR levels are equal and symmetric in the upper and lower extremities. Cranial nerves II through XII are grossly intact. Proprioception is intact. No peripheral adenopathy or edema is identified. No motor or sensory levels are noted. Crude visual fields are within normal range.  LABORATORY DATA: Pathology report reviewed    RADIOLOGY RESULTS: Ultrasound used  today for volume study anticipation of I-125 interstitial implant   IMPRESSION: Stage I Gleason 6 (3+3) adenocarcinoma the prostate presenting with a PSA of 62 in 68 year old male refusing surgery for I-125 interstitial implant with curative intent  PLAN: At this time patient is undergone volume study which will determine our source placement of I-125 sources.  Risks and benefits of treatment including increased lower urinary tract symptoms possible diarrhea fatigue and risks and benefits of general surgery all were reviewed with the patient.  I have also reviewed radiation safety precautions.  Patient is already scheduled for implant.  Patient comprehends her treatment plan well is cleared from our department to proceed with implant.  I would like to take this opportunity to thank you for allowing me to participate in the care of your patient.Noreene Filbert, MD

## 2019-05-23 NOTE — Progress Notes (Signed)
Radiation Oncology Volume study note  Name: Manuel Patel   Date:   05/23/2019 MRN:  [id number] DOB: 16-Nov-1950    This 68 y.o. male presents to the hospital today for volume study in anticipation of I-125 interstitial implant with curative intent for stage I Gleason 6 adenocarcinoma the prostate  REFERRING PROVIDER: Albina Billet, MD  HPI: Patient is a 68 year old male under active surveillance for the past 2 years after biopsy in 2018 was positive with 2 of 12 cores for Gleason 6 (3+3) adenocarcinoma.  His PSA did jump to 6 recently underwent fusion biopsy again showing Gleason 6 adenocarcinoma.  Patient refused surgical resection he is seen today for volume study anticipation of I-125 interstitial implant with curative intent.  He is doing well no increased lower urinary tract symptoms at this time.  Patient was taken to the OR for volume study .  COMPLICATIONS OF TREATMENT: none  FOLLOW UP COMPLIANCE: keeps appointments   PHYSICAL EXAM:  There were no vitals taken for this visit. Well-developed well-nourished patient in NAD. HEENT reveals PERLA, EOMI, discs not visualized.  Oral cavity is clear. No oral mucosal lesions are identified. Neck is clear without evidence of cervical or supraclavicular adenopathy. Lungs are clear to A&P. Cardiac examination is essentially unremarkable with regular rate and rhythm without murmur rub or thrill. Abdomen is benign with no organomegaly or masses noted. Motor sensory and DTR levels are equal and symmetric in the upper and lower extremities. Cranial nerves II through XII are grossly intact. Proprioception is intact. No peripheral adenopathy or edema is identified. No motor or sensory levels are noted. Crude visual fields are within normal range.  RADIOLOGY RESULTS: Ultrasound used for volume study today  PLAN: Patient was taken to the cystoscopy suite in the OR. Patient was placed in the low lithotomy position. Foley catheter was placed. Trans-rectal  ultrasound probe was inserted into the rectum and prostate seminal vesicles were visualized as well as bladder base. stepping images were performed on a 5 mm increments. Images will be placed in [employer] treatment planning system to determine seed placement coordinates for eventual I-125 interstitial implant. Images will be reviewed with the physics and dosimetry staff for final quality approval. I personally was present for the volume study and assisted in delineation of contour volumes.  At the end of the procedure Foley catheter was removed, rectal ultrasound probe was removed. Patient tolerated his procedures extremely well with no side effects or complaints. Patient has given appointment for interstitial implant date. Consent was signed today as well as history and physical performed in preparation for his outpatient surgical implant.     Noreene Filbert, MD

## 2019-05-23 NOTE — H&P (Signed)
History and physical  Name: Manuel Patel  MRN: [id number]  Date:   05/23/2019     DOB: 29-Oct-1950   This 68 y.o. male patient presents to the clinic for history and physical in preparation for I-125 interstitial implant for adenocarcinoma of the prostate stage I  REFERRING PHYSICIAN: Albina Billet, MD  CHIEF COMPLAINT:  Chief Complaint  Patient presents with  . Prostate Cancer    Post volume study    DIAGNOSIS: The encounter diagnosis was Prostate cancer ([place]).   PREVIOUS INVESTIGATIONS:  Clinical notes reviewed Pathology report reviewed  HPI: Patient is a 68 year old male under active surveillance for the past 2 years after biopsy in 2018 was positive with 2 of 12 cores for Gleason 6 (3+3) adenocarcinoma.  His PSA did jump to 6 recently underwent fusion biopsy again showing Gleason 6 adenocarcinoma.  Patient refused surgical resection he is seen today for volume study anticipation of I-125 interstitial implant with curative intent.  He is doing well no increased lower urinary tract symptoms at this time.  PLANNED TREATMENT REGIMEN: I-125 interstitial implant  PAST MEDICAL HISTORY:  has a past medical history of Blind left eye, Cancer ([place]) (2019), Glaucoma, right eye, History of bronchitis, History of kidney stones, Hypertension, and Numbness and tingling in left arm.    PAST SURGICAL HISTORY:  Past Surgical History:  Procedure Laterality Date  . ANKLE SURGERY Right   . ANKLE SURGERY    . ANTERIOR CERVICAL DECOMP/DISCECTOMY FUSION N/A 03/17/2016   Procedure: ANTERIOR CERVICAL DECOMPRESSION FUSION CERVICAL 3-4, CERVICAL 4-5, CERVICAL 5-6 WITH INSTRUMENTATION AND ALLOGRAFT;  Surgeon: Phylliss Bob, MD;  Location: [facility];  Service: Orthopedics;  Laterality: N/A;  ANTERIOR CERVICAL DECOMPRESSION FUSION CERVICAL 3-4, CERVICAL 4-5, CERVICAL 5-6 WITH INSTRUMENTATION AND ALLOGRAFT  . COLONOSCOPY    . COLONOSCOPY WITH PROPOFOL N/A 03/29/2018   Procedure: COLONOSCOPY WITH PROPOFOL;   Surgeon: Robert Bellow, MD;  Location: [facility];  Service: Endoscopy;  Laterality: N/A;  . EYE SURGERY Left    prosthetic left eye  . EYE SURGERY Right    cataract    FAMILY HISTORY: family history is not on file.  SOCIAL HISTORY:  reports that he has never smoked. He has never used smokeless tobacco. He reports that he does not drink alcohol or use drugs.  ALLERGIES: Penicillin v and Pineapple  MEDICATIONS:  Current Outpatient Medications  Medication Sig Dispense Refill  . amLODipine (NORVASC) 10 MG tablet Take 10 mg by mouth daily.     Marland Kitchen atenolol (TENORMIN) 50 MG tablet Take 50 mg by mouth 2 (two) times daily.  0  . fluticasone (FLONASE) 50 MCG/ACT nasal spray Place 1 spray into both nostrils daily as needed for allergies or rhinitis.    Marland Kitchen ibuprofen (ADVIL) 200 MG tablet Take 400 mg by mouth daily as needed for moderate pain.    . Multiple Vitamin (MULTIVITAMIN) tablet Take 1 tablet by mouth 3 (three) times a week.     . tamsulosin (FLOMAX) 0.4 MG CAPS capsule Take 1 capsule (0.4 mg total) by mouth daily. (Patient taking differently: Take 0.4 mg by mouth at bedtime. ) 30 capsule 6  . timolol (TIMOPTIC-XR) 0.5 % ophthalmic gel-forming Place 1 drop into the right eye at bedtime.  0   No current facility-administered medications for this encounter.    Facility-Administered Medications Ordered in Other Encounters  Medication Dose Route Frequency Provider Last Rate Last Dose  . ciprofloxacin (CIPRO) IVPB 400 mg  400 mg Intravenous 60 min  Pre-Op Stoioff, Ronda Fairly, MD        ECOG PERFORMANCE STATUS:  0 - Asymptomatic  REVIEW OF SYSTEMS: Patient denies any weight loss, fatigue, weakness, fever, chills or night sweats. Patient denies any loss of vision, blurred vision. Patient denies any ringing  of the ears or hearing loss. No irregular heartbeat. Patient denies heart murmur or history of fainting. Patient denies any chest pain or pain radiating to her upper extremities. Patient  denies any shortness of breath, difficulty breathing at night, cough or hemoptysis. Patient denies any swelling in the lower legs. Patient denies any nausea vomiting, vomiting of blood, or coffee ground material in the vomitus. Patient denies any stomach pain. Patient states has had normal bowel movements no significant constipation or diarrhea. Patient denies any dysuria, hematuria or significant nocturia. Patient denies any problems walking, swelling in the joints or loss of balance. Patient denies any skin changes, loss of hair or loss of weight. Patient denies any excessive worrying or anxiety or significant depression. Patient denies any problems with insomnia. Patient denies excessive thirst, polyuria, polydipsia. Patient denies any swollen glands, patient denies easy bruising or easy bleeding. Patient denies any recent infections, allergies or URI. Patient "s visual fields have not changed significantly in recent time.   PHYSICAL EXAM: BP (!) 122/58 (BP Location: Left Arm, Patient Position: Sitting)   Pulse (!) 48   Temp (!) 97.1 F (36.2 C) (Tympanic)   Resp 16   Wt 212 lb 12.8 oz (96.5 kg)   BMI 30.53 kg/m  Well-developed well-nourished patient in NAD. HEENT reveals PERLA, EOMI, discs not visualized.  Oral cavity is clear. No oral mucosal lesions are identified. Neck is clear without evidence of cervical or supraclavicular adenopathy. Lungs are clear to A&P. Cardiac examination is essentially unremarkable with regular rate and rhythm without murmur rub or thrill. Abdomen is benign with no organomegaly or masses noted. Motor sensory and DTR levels are equal and symmetric in the upper and lower extremities. Cranial nerves II through XII are grossly intact. Proprioception is intact. No peripheral adenopathy or edema is identified. No motor or sensory levels are noted. Crude visual fields are within normal range.  LABORATORY DATA: Pathology report reviewed    RADIOLOGY RESULTS: Ultrasound used  today for volume study anticipation of I-125 interstitial implant   IMPRESSION: Stage I Gleason 6 (3+3) adenocarcinoma the prostate presenting with a PSA of 51 in 68 year old male refusing surgery for I-125 interstitial implant with curative intent  PLAN: At this time patient is undergone volume study which will determine our source placement of I-125 sources.  Risks and benefits of treatment including increased lower urinary tract symptoms possible diarrhea fatigue and risks and benefits of general surgery all were reviewed with the patient.  I have also reviewed radiation safety precautions.  Patient is already scheduled for implant.  Patient comprehends her treatment plan well is cleared from our department to proceed with implant.  I would like to take this opportunity to thank you for allowing me to participate in the care of your patient.Noreene Filbert, MD

## 2019-05-25 DIAGNOSIS — C61 Malignant neoplasm of prostate: Secondary | ICD-10-CM | POA: Diagnosis not present

## 2019-06-12 ENCOUNTER — Encounter
Admission: RE | Admit: 2019-06-12 | Discharge: 2019-06-12 | Disposition: A | Discharge status: Home / Self Care | Payer: Federal, State, Local not specified - PPO | Source: Ambulatory Visit | Attending: Urology | Admitting: Urology

## 2019-06-12 ENCOUNTER — Other Ambulatory Visit: Payer: Self-pay

## 2019-06-12 DIAGNOSIS — I10 Essential (primary) hypertension: Secondary | ICD-10-CM | POA: Insufficient documentation

## 2019-06-12 DIAGNOSIS — Z01818 Encounter for other preprocedural examination: Secondary | ICD-10-CM | POA: Diagnosis present

## 2019-06-12 DIAGNOSIS — C61 Malignant neoplasm of prostate: Secondary | ICD-10-CM

## 2019-06-12 DIAGNOSIS — Z01810 Encounter for preprocedural cardiovascular examination: Secondary | ICD-10-CM

## 2019-06-12 DIAGNOSIS — R001 Bradycardia, unspecified: Secondary | ICD-10-CM | POA: Insufficient documentation

## 2019-06-12 HISTORY — DX: Bronchitis, not specified as acute or chronic: J40

## 2019-06-12 LAB — BASIC METABOLIC PANEL
Anion gap: 11 (ref 5–15)
BUN: 17 mg/dL (ref 8–23)
CO2: 23 mmol/L (ref 22–32)
Calcium: 9.6 mg/dL (ref 8.9–10.3)
Chloride: 105 mmol/L (ref 98–111)
Creatinine, Ser: 0.73 mg/dL (ref 0.61–1.24)
GFR calc Af Amer: >60 mL/min (ref >60)
GFR calc non Af Amer: >60 mL/min (ref >60)
Glucose, Bld: 95 mg/dL (ref 70–99)
Potassium: 4.5 mmol/L (ref 3.5–5.1)
Sodium: 139 mmol/L (ref 135–145)

## 2019-06-12 MED ORDER — FLEET ENEMA 7-19 GM/118ML RE ENEM
1.0000 | ENEMA | Freq: Once | RECTAL | Status: DC
  Filled 2019-06-12: qty 1

## 2019-06-12 NOTE — Patient Instructions (Addendum)
Your procedure is scheduled on: 06/19/2019 Tues Report to Same Day Surgery 2nd floor medical mall [facility] Entrance-take elevator on left to 2nd floor.  Check in with surgery information desk.) To find out your arrival time please call [phone] between 1PM - 3PM on 06/18/2019 Mon  Remember: Instructions that are not followed completely may result in serious medical risk, up to and including death, or upon the discretion of your surgeon and anesthesiologist your surgery may need to be rescheduled.    _x___ 1. Do not eat food after midnight the night before your procedure. You may drink clear liquids up to 2 hours before you are scheduled to arrive at the hospital for your procedure.  Do not drink clear liquids within 2 hours of your scheduled arrival to the hospital.  Clear liquids include  --Water or Apple juice without pulp  --Clear carbohydrate beverage such as ClearFast or Gatorade  --Black Coffee or Clear Tea (No milk, no creamers, do not add anything to                  the coffee or Tea Type 1 and type 2 diabetics should only drink water.   ____Ensure clear carbohydrate drink on the way to the hospital for bariatric patients  ____Ensure clear carbohydrate drink 3 hours before surgery.   No gum chewing or hard candies.     __x__ 2. No Alcohol for 24 hours before or after surgery.   __x__3. No Smoking or e-cigarettes for 24 prior to surgery.  Do not use any chewable tobacco products for at least 6 hour prior to surgery   ____  4. Bring all medications with you on the day of surgery if instructed.    __x__ 5. Notify your doctor if there is any change in your medical condition     (cold, fever, infections).    x___6. On the morning of surgery brush your teeth with toothpaste and water.  You may rinse your mouth with mouth wash if you wish.  Do not swallow any toothpaste or mouthwash.   Do not wear jewelry, make-up, hairpins, clips or nail polish.  Do not wear lotions,  powders, or perfumes. You may wear deodorant.  Do not shave 48 hours prior to surgery. Men may shave face and neck.  Do not bring valuables to the hospital.    [facility] is not responsible for any belongings or valuables.               Contacts, dentures or bridgework may not be worn into surgery.  Leave your suitcase in the car. After surgery it may be brought to your room.  For patients admitted to the hospital, discharge time is determined by your                       treatment team.  _  Patients discharged the day of surgery will not be allowed to drive home.  You will need someone to drive you home and stay with you the night of your procedure.    Please read over the following fact sheets that you were given:   [facility] Preparing for Surgery and or MRSA Information   _x___ Take anti-hypertensive listed below, cardiac, seizure, asthma,     anti-reflux and psychiatric medicines. These include:  1. atenolol (TENORMIN) 50 MG tablet  2.tamsulosin (FLOMAX) 0.4 MG CAPS capsule  3.fluticasone (FLONASE) 50 MCG/ACT nasal spray if needed  4.  5.  6.  _x___Fleets enema or Magnesium Citrate as directed.   _x___ Use CHG Soap or sage wipes as directed on instruction sheet   ____ Use inhalers on the day of surgery and bring to hospital day of surgery  ____ Stop Metformin and Janumet 2 days prior to surgery.    ____ Take 1/2 of usual insulin dose the night before surgery and none on the morning     surgery.   _x___ Follow recommendations from Cardiologist, Pulmonologist or PCP regarding          stopping Aspirin, Coumadin, Plavix ,Eliquis, Effient, or Pradaxa, and Pletal.  X____Stop Anti-inflammatories such as Advil, Aleve, Ibuprofen, Motrin, Naproxen, Naprosyn, Goodies powders or aspirin products. OK to take Tylenol and                          Celebrex.   _x___ Stop supplements until after surgery.  But may continue Vitamin D, Vitamin B,       and multivitamin.   ____ Bring C-Pap  to the hospital.

## 2019-06-13 ENCOUNTER — Encounter: Payer: Self-pay | Admitting: Urology

## 2019-06-15 ENCOUNTER — Other Ambulatory Visit
Admission: RE | Admit: 2019-06-15 | Discharge: 2019-06-15 | Disposition: A | Discharge status: Home / Self Care | Payer: Federal, State, Local not specified - PPO | Source: Ambulatory Visit | Attending: Urology | Admitting: Urology

## 2019-06-15 ENCOUNTER — Other Ambulatory Visit: Payer: Self-pay

## 2019-06-15 DIAGNOSIS — Z20828 Contact with and (suspected) exposure to other viral communicable diseases: Secondary | ICD-10-CM | POA: Diagnosis not present

## 2019-06-15 DIAGNOSIS — Z01812 Encounter for preprocedural laboratory examination: Secondary | ICD-10-CM | POA: Diagnosis not present

## 2019-06-15 LAB — SARS CORONAVIRUS 2 (TAT 6-24 HRS): SARS Coronavirus 2: NEGATIVE

## 2019-06-19 ENCOUNTER — Ambulatory Visit: Payer: Federal, State, Local not specified - PPO

## 2019-06-19 ENCOUNTER — Ambulatory Visit: Payer: Federal, State, Local not specified - PPO | Admitting: Anesthesiology

## 2019-06-19 ENCOUNTER — Encounter
Admission: RE | Disposition: A | Discharge status: Home / Self Care | Payer: Self-pay | Source: Home / Self Care | Attending: Urology

## 2019-06-19 ENCOUNTER — Ambulatory Visit
Admission: RE | Admit: 2019-06-19 | Discharge: 2019-06-19 | Disposition: A | Discharge status: Home / Self Care | Payer: Federal, State, Local not specified - PPO | Source: Ambulatory Visit | Attending: Radiation Oncology | Admitting: Radiation Oncology

## 2019-06-19 ENCOUNTER — Ambulatory Visit
Admission: RE | Admit: 2019-06-19 | Discharge: 2019-06-19 | Disposition: A | Discharge status: Home / Self Care | Payer: Federal, State, Local not specified - PPO | Attending: Urology | Admitting: Urology

## 2019-06-19 ENCOUNTER — Encounter: Payer: Self-pay | Admitting: *Deleted

## 2019-06-19 ENCOUNTER — Other Ambulatory Visit: Payer: Self-pay

## 2019-06-19 DIAGNOSIS — H5462 Unqualified visual loss, left eye, normal vision right eye: Secondary | ICD-10-CM | POA: Insufficient documentation

## 2019-06-19 DIAGNOSIS — Z79899 Other long term (current) drug therapy: Secondary | ICD-10-CM | POA: Diagnosis not present

## 2019-06-19 DIAGNOSIS — C61 Malignant neoplasm of prostate: Secondary | ICD-10-CM

## 2019-06-19 DIAGNOSIS — I10 Essential (primary) hypertension: Secondary | ICD-10-CM | POA: Diagnosis not present

## 2019-06-19 DIAGNOSIS — H409 Unspecified glaucoma: Secondary | ICD-10-CM | POA: Insufficient documentation

## 2019-06-19 HISTORY — PX: RADIOACTIVE SEED IMPLANT: SHX5150

## 2019-06-19 HISTORY — PX: CYSTOSCOPY: SHX5120

## 2019-06-19 LAB — CBC
HCT: 42.3 % (ref 39.0–52.0)
Hemoglobin: 14.7 g/dL (ref 13.0–17.0)
MCH: 29.2 pg (ref 26.0–34.0)
MCHC: 34.8 g/dL (ref 30.0–36.0)
MCV: 83.9 fL (ref 80.0–100.0)
Platelets: 237 10*3/uL (ref 150–400)
RBC: 5.04 MIL/uL (ref 4.22–5.81)
RDW: 13.9 % (ref 11.5–15.5)
WBC: 9.8 10*3/uL (ref 4.0–10.5)
nRBC: 0 % (ref 0.0–0.2)

## 2019-06-19 SURGERY — INSERTION, RADIATION SOURCE, PROSTATE
Anesthesia: General | Site: Prostate

## 2019-06-19 MED ORDER — DEXAMETHASONE SODIUM PHOSPHATE 10 MG/ML IJ SOLN
INTRAMUSCULAR | Status: AC
  Filled 2019-06-19: qty 1

## 2019-06-19 MED ORDER — BACITRACIN ZINC 500 UNIT/GM EX OINT
TOPICAL_OINTMENT | CUTANEOUS | Status: AC
  Filled 2019-06-19: qty 28.35

## 2019-06-19 MED ORDER — BACITRACIN 500 UNIT/GM EX OINT
TOPICAL_OINTMENT | CUTANEOUS | Status: DC | PRN
  Administered 2019-06-19: 1 via TOPICAL

## 2019-06-19 MED ORDER — ONDANSETRON HCL 4 MG/2ML IJ SOLN
INTRAMUSCULAR | Status: AC
  Filled 2019-06-19: qty 2

## 2019-06-19 MED ORDER — SUCCINYLCHOLINE CHLORIDE 20 MG/ML IJ SOLN
INTRAMUSCULAR | Status: DC | PRN
  Administered 2019-06-19: 120 mg via INTRAVENOUS

## 2019-06-19 MED ORDER — DEXAMETHASONE SODIUM PHOSPHATE 10 MG/ML IJ SOLN
INTRAMUSCULAR | Status: DC | PRN
  Administered 2019-06-19: 10 mg via INTRAVENOUS

## 2019-06-19 MED ORDER — CIPROFLOXACIN HCL 500 MG PO TABS
ORAL_TABLET | ORAL | Status: AC
  Filled 2019-06-19: qty 1

## 2019-06-19 MED ORDER — ROCURONIUM BROMIDE 100 MG/10ML IV SOLN
INTRAVENOUS | Status: DC | PRN
  Administered 2019-06-19: 50 mg via INTRAVENOUS
  Administered 2019-06-19: 10 mg via INTRAVENOUS

## 2019-06-19 MED ORDER — MIDAZOLAM HCL 2 MG/2ML IJ SOLN
INTRAMUSCULAR | Status: AC
  Filled 2019-06-19: qty 2

## 2019-06-19 MED ORDER — PROPOFOL 10 MG/ML IV BOLUS
INTRAVENOUS | Status: AC
  Filled 2019-06-19: qty 40

## 2019-06-19 MED ORDER — GLYCOPYRROLATE 0.2 MG/ML IJ SOLN
INTRAMUSCULAR | Status: DC | PRN
  Administered 2019-06-19: 0.2 mg via INTRAVENOUS

## 2019-06-19 MED ORDER — SUGAMMADEX SODIUM 500 MG/5ML IV SOLN
INTRAVENOUS | Status: AC
  Filled 2019-06-19: qty 5

## 2019-06-19 MED ORDER — CIPROFLOXACIN HCL 500 MG PO TABS: 500.0000 mg | ORAL_TABLET | Freq: Once | ORAL | Status: DC

## 2019-06-19 MED ORDER — PROPOFOL 10 MG/ML IV BOLUS: INTRAVENOUS | Status: DC | PRN

## 2019-06-19 MED ORDER — PROPOFOL 10 MG/ML IV BOLUS
INTRAVENOUS | Status: AC
  Filled 2019-06-19: qty 20

## 2019-06-19 MED ORDER — FENTANYL CITRATE (PF) 100 MCG/2ML IJ SOLN: 25.0000 ug | INTRAMUSCULAR | Status: DC | PRN

## 2019-06-19 MED ORDER — FAMOTIDINE 20 MG PO TABS
ORAL_TABLET | ORAL | Status: AC
  Administered 2019-06-19: 20 mg via ORAL
  Filled 2019-06-19: qty 1

## 2019-06-19 MED ORDER — HYDROCODONE-ACETAMINOPHEN 5-325 MG PO TABS: 1.0000 | ORAL_TABLET | Freq: Four times a day (QID) | ORAL | 0 refills | Status: AC | PRN

## 2019-06-19 MED ORDER — PHENYLEPHRINE HCL (PRESSORS) 10 MG/ML IV SOLN
INTRAVENOUS | Status: DC | PRN
  Administered 2019-06-19 (×2): 100 ug via INTRAVENOUS

## 2019-06-19 MED ORDER — LIDOCAINE HCL (CARDIAC) PF 100 MG/5ML IV SOSY
PREFILLED_SYRINGE | INTRAVENOUS | Status: DC | PRN
  Administered 2019-06-19: 80 mg via INTRAVENOUS

## 2019-06-19 MED ORDER — HYDROCODONE-ACETAMINOPHEN 5-325 MG PO TABS
1.0000 | ORAL_TABLET | Freq: Once | ORAL | Status: AC
  Administered 2019-06-19: 1 via ORAL

## 2019-06-19 MED ORDER — PROPOFOL 10 MG/ML IV BOLUS
INTRAVENOUS | Status: DC | PRN
  Administered 2019-06-19: 160 mg via INTRAVENOUS

## 2019-06-19 MED ORDER — ROCURONIUM BROMIDE 50 MG/5ML IV SOLN
INTRAVENOUS | Status: AC
  Filled 2019-06-19: qty 1

## 2019-06-19 MED ORDER — MIDAZOLAM HCL 2 MG/2ML IJ SOLN
INTRAMUSCULAR | Status: DC | PRN
  Administered 2019-06-19: 2 mg via INTRAVENOUS

## 2019-06-19 MED ORDER — SUGAMMADEX SODIUM 200 MG/2ML IV SOLN
INTRAVENOUS | Status: AC
  Filled 2019-06-19: qty 2

## 2019-06-19 MED ORDER — HYDROCODONE-ACETAMINOPHEN 5-325 MG PO TABS
ORAL_TABLET | ORAL | Status: AC
  Filled 2019-06-19: qty 1

## 2019-06-19 MED ORDER — FENTANYL CITRATE (PF) 100 MCG/2ML IJ SOLN
INTRAMUSCULAR | Status: DC | PRN
  Administered 2019-06-19: 50 ug via INTRAVENOUS

## 2019-06-19 MED ORDER — LACTATED RINGERS IV SOLN
INTRAVENOUS | Status: DC
  Administered 2019-06-19 (×2): via INTRAVENOUS

## 2019-06-19 MED ORDER — GLYCOPYRROLATE 0.2 MG/ML IJ SOLN
INTRAMUSCULAR | Status: AC
  Filled 2019-06-19: qty 1

## 2019-06-19 MED ORDER — ONDANSETRON HCL 4 MG/2ML IJ SOLN
INTRAMUSCULAR | Status: DC | PRN
  Administered 2019-06-19 (×2): 4 mg via INTRAVENOUS

## 2019-06-19 MED ORDER — FAMOTIDINE 20 MG PO TABS
20.0000 mg | ORAL_TABLET | Freq: Once | ORAL | Status: AC
  Administered 2019-06-19: 07:00:00 20 mg via ORAL

## 2019-06-19 MED ORDER — EPHEDRINE SULFATE 50 MG/ML IJ SOLN
INTRAMUSCULAR | Status: DC | PRN
  Administered 2019-06-19 (×2): 5 mg via INTRAVENOUS

## 2019-06-19 MED ORDER — SUGAMMADEX SODIUM 500 MG/5ML IV SOLN
INTRAVENOUS | Status: DC | PRN
  Administered 2019-06-19: 500 mg via INTRAVENOUS

## 2019-06-19 MED ORDER — ONDANSETRON HCL 4 MG/2ML IJ SOLN: 4.0000 mg | Freq: Once | INTRAMUSCULAR | Status: DC | PRN

## 2019-06-19 MED ORDER — SUCCINYLCHOLINE CHLORIDE 20 MG/ML IJ SOLN
INTRAMUSCULAR | Status: AC
  Filled 2019-06-19: qty 1

## 2019-06-19 MED ORDER — FENTANYL CITRATE (PF) 100 MCG/2ML IJ SOLN
INTRAMUSCULAR | Status: AC
  Filled 2019-06-19: qty 2

## 2019-06-19 SURGICAL SUPPLY — 22 items
BAG URINE DRAIN 2000ML AR STRL (UROLOGICAL SUPPLIES) ×3 IMPLANT
BLADE CLIPPER SURG (BLADE) ×3 IMPLANT
CATH FOL 2WAY LX 16X5 (CATHETERS) ×3 IMPLANT
COVER BACK TABLE REUSABLE LG (DRAPES) ×3 IMPLANT
COVER WAND RF STERILE (DRAPES) IMPLANT
DRAPE INCISE 23X17 IOBAN STRL (DRAPES) ×2
DRAPE INCISE IOBAN 23X17 STRL (DRAPES) ×4 IMPLANT
DRAPE UNDER BUTTOCK W/FLU (DRAPES) ×3 IMPLANT
DRSG TELFA 3X8 NADH (GAUZE/BANDAGES/DRESSINGS) ×3 IMPLANT
GLOVE BIO SURGEON STRL SZ7.5 (GLOVE) ×6 IMPLANT
GLOVE BIO SURGEON STRL SZ8 (GLOVE) ×3 IMPLANT
GOWN STRL REUS W/ TWL LRG LVL3 (GOWN DISPOSABLE) ×4 IMPLANT
GOWN STRL REUS W/ TWL XL LVL3 (GOWN DISPOSABLE) ×4 IMPLANT
GOWN STRL REUS W/TWL LRG LVL3 (GOWN DISPOSABLE) ×2
GOWN STRL REUS W/TWL XL LVL3 (GOWN DISPOSABLE) ×2
KIT TURNOVER CYSTO (KITS) ×3 IMPLANT
PACK CYSTO AR (MISCELLANEOUS) ×3 IMPLANT
SET CYSTO W/LG BORE CLAMP LF (SET/KITS/TRAYS/PACK) ×3 IMPLANT
SURGILUBE 2OZ TUBE FLIPTOP (MISCELLANEOUS) ×3 IMPLANT
SYR 10ML LL (SYRINGE) ×3 IMPLANT
SYRINGE IRR TOOMEY STRL 70CC (SYRINGE) ×3 IMPLANT
WATER STERILE IRR 1000ML POUR (IV SOLUTION) ×3 IMPLANT

## 2019-06-19 NOTE — Progress Notes (Signed)
Radiation Oncology I-125 interstitial implant note  Name: Manuel Patel   Date:   06/19/2019 MRN:  [id number] DOB: 1950-11-19    This 68 y.o. male presents to the hospital today for I-125 interstitial implant for stage I adenocarcinoma the prostate  REFERRING PROVIDER: Albina Billet, MD  HPI: Patient is a 68 year old male.  Who is been under active surveillance for 2 years after biopsy in 2018 with 2 cores positive for Gleason 6 (3+3) adenocarcinoma.  His PSA jumped to 6 under went again fusion biopsy again showing Gleason 6 adenocarcinoma he refused surgical intervention.  He was taken to the OR today for I-125 interstitial implant with curative intent.  COMPLICATIONS OF TREATMENT: none  FOLLOW UP COMPLIANCE: keeps appointments   PHYSICAL EXAM:  There were no vitals taken for this visit. Well-developed well-nourished patient in NAD. HEENT reveals PERLA, EOMI, discs not visualized.  Oral cavity is clear. No oral mucosal lesions are identified. Neck is clear without evidence of cervical or supraclavicular adenopathy. Lungs are clear to A&P. Cardiac examination is essentially unremarkable with regular rate and rhythm without murmur rub or thrill. Abdomen is benign with no organomegaly or masses noted. Motor sensory and DTR levels are equal and symmetric in the upper and lower extremities. Cranial nerves II through XII are grossly intact. Proprioception is intact. No peripheral adenopathy or edema is identified. No motor or sensory levels are noted. Crude visual fields are within normal range.  RADIOLOGY RESULTS: Ultrasound and plain films used for procedure  PLAN: Patient was taken to the operating room and general anesthesia was administered. Legs were immobilized in stirrups and patient was positioned in the exact same proportions as original volume study. Patient was prepped and Foley catheter was placed. Ultrasound guidance identified the prostate and recreated the original set up as per  treatment planning volume study. 26 needles were placed under ultrasound guidance with PVCs delivered to the prostate volume.  A total of 75 seeds were placed and counted after completion of procedure cystoscopy was performed by urology and no evidence of seeds in the bladder were noted. Patient tolerated the procedure extremely well. Initial plain film as doublecheck identified 80 seeds in the prostate. Patient has followup appointment in one month for CT scan for quality assurance will be performed.    Noreene Filbert, MD

## 2019-06-19 NOTE — Interval H&P Note (Signed)
History and Physical Interval Note:  06/19/2019 7:19 AM  Manuel Patel  has presented today for surgery, with the diagnosis of Prostate Cancer.  The various methods of treatment have been discussed with the patient and family. After consideration of risks, benefits and other options for treatment, the patient has consented to  Procedure(s): RADIOACTIVE SEED IMPLANT/BRACHYTHERAPY IMPLANT (N/A) as a surgical intervention.  The patient's history has been reviewed, patient examined, no change in status, stable for surgery.  I have reviewed the patient's chart and labs.  Questions were answered to the patient's satisfaction.     [place]

## 2019-06-19 NOTE — Discharge Instructions (Signed)
Brachytherapy for Prostate Cancer, Care After ° °This sheet gives you information about how to care for yourself after your procedure. Your health care provider may also give you more specific instructions. If you have problems or questions, contact your health care provider. °What can I expect after the procedure? °After the procedure, it is common to have: °· Trouble passing urine. °· Blood in the urine or semen. °· Constipation. °· Frequent feeling of an urgent need to urinate. °· Bruising, swelling, and tenderness of the area behind the scrotum (perineum). °· Bloating and gas. °· Fatigue. °· Burning or pain in the rectum. °· Problems getting or keeping an erection (erectile dysfunction). °· Nausea. °Follow these instructions at home: °Managing pain, stiffness, and swelling °· If directed, apply ice to the affected area: °? Put ice in a plastic bag. °? Place a towel between your skin and the bag. °? Leave the ice on for 20 minutes, 2-3 times a day. °· Try not to sit directly on the area behind the scrotum. A soft cushion can help with discomfort. °Activity °· Do not drive for 24 hours if you were given a medicine to help you relax (sedative). °· Do not drive or use heavy machinery while taking prescription pain medicine. °· Rest as told by your health care provider. °· Most people can return to normal activities a few days or weeks after the procedure. Ask your health care provider what activities are safe for you. °Eating and drinking °· Drink enough fluid to keep your urine clear or pale yellow. °· Eat a healthy, balanced diet. This includes lean proteins, whole grains, and plenty of fruits and vegetables. °General instructions °· Take over-the-counter and prescription medicines only as told by your health care provider. °· Keep all follow-up visits as told by your health care provider. This is important. You may still need additional treatment. °· Do not take baths, swim, or use a hot tub until your health  care provider approves. Shower and wash the area behind the scrotum gently. °· Do not have sex for one week after the treatment, or until your health care provider approves. °· If you have permanent, low-dose brachytherapy implants: °? Limit close contact with children and pregnant women for 2 months or as told by your health care provider. This is important because of the radiation that is still active in the prostate. °? You may set off radioactive sensors, such as airport screenings. Ask your health care provider for a document that explains your treatment. °? You may be instructed to use a condom during sex for the first 2 months after low-dose brachytherapy. °Contact a health care provider if: °· You have a fever or chills. °· You do not have a bowel movement for 3-4 days after the procedure. °· You have diarrhea for 3-4 days after the procedure. °· You develop any new symptoms, such as problems with urinating or erectile dysfunction. °· You have abdomen (abdominal) pain. °· You have more blood in your urine. °Get help right away if: °· You cannot urinate. °· There is excessive bleeding from your rectum. °· You have unusual drainage coming from your rectum. °· You have severe pain in the treated area that does not go away with pain medicine. °· You have severe nausea or vomiting. °Summary °· If you have permanent, low-dose brachytherapy implants, limit close contact with children and pregnant women for 2 months or as told by your health care provider. This is important because of the radiation   that is still active in the prostate. °· Talk with your health care provider about your risk of brachytherapy side effects, such as erectile dysfunction or urinary problems. Your health care provider will be able to recommend possible treatment options. °· Keep all follow-up visits as told by your health care provider. This is important. You may need additional treatment. °This information is not intended to replace  advice given to you by your health care provider. Make sure you discuss any questions you have with your health care provider. °Document Released: 08/28/2010 Document Revised: 07/08/2017 Document Reviewed: 08/27/2016 °Elsevier Patient Education © 2020 [employer] ° °

## 2019-06-19 NOTE — Anesthesia Procedure Notes (Signed)
Procedure Name: Intubation Performed by: Kelton Pillar, CRNA Pre-anesthesia Checklist: Patient identified, Emergency Drugs available and Suction available Patient Re-evaluated:Patient Re-evaluated prior to induction Oxygen Delivery Method: Circle system utilized Preoxygenation: Pre-oxygenation with 100% oxygen Induction Type: IV induction Ventilation: Two handed mask ventilation required Laryngoscope Size: McGraph and 4 Grade View: Grade I Tube type: Oral Tube size: 7.0 mm Number of attempts: 1 Airway Equipment and Method: Stylet Placement Confirmation: ETT inserted through vocal cords under direct vision,  positive ETCO2,  CO2 detector and breath sounds checked- equal and bilateral Secured at: 21 cm Tube secured with: Tape Dental Injury: Teeth and Oropharynx as per pre-operative assessment

## 2019-06-19 NOTE — Anesthesia Postprocedure Evaluation (Signed)
Anesthesia Post Note  Patient: Manuel Patel  Procedure(s) Performed: RADIOACTIVE SEED IMPLANT/BRACHYTHERAPY IMPLANT (N/A Prostate) CYSTOSCOPY (N/A Bladder)  Patient location during evaluation: PACU Anesthesia Type: General Level of consciousness: awake and alert and oriented Pain management: pain level controlled Vital Signs Assessment: post-procedure vital signs reviewed and stable Respiratory status: spontaneous breathing Cardiovascular status: blood pressure returned to baseline Anesthetic complications: no     Last Vitals:  Vitals:   06/19/19 0938 06/19/19 1147  BP: 132/74 126/64  Pulse: (!) 54 (!) 54  Resp: 16   Temp: (!) 36.3 C   SpO2: 96% 98%    Last Pain:  Vitals:   06/19/19 1051  TempSrc:   PainSc: 8                  Merridith Dershem

## 2019-06-19 NOTE — Anesthesia Preprocedure Evaluation (Signed)
Anesthesia Evaluation  Patient identified by MRN, date of birth, ID band Patient awake    Reviewed: Allergy & Precautions, H&P , Patient's Chart, lab work & pertinent test results, reviewed documented beta blocker date and time   Airway Mallampati: II  TM Distance: >3 FB Neck ROM: full   Comment: Thinned front teeth... Discussed ental injury Dental no notable dental hx.    Pulmonary    Pulmonary exam normal breath sounds clear to auscultation       Cardiovascular hypertension, On Home Beta Blockers and On Medications  Rhythm:regular Rate:Normal     Neuro/Psych    GI/Hepatic   Endo/Other    Renal/GU      Musculoskeletal   Abdominal   Peds  Hematology   Anesthesia Other Findings HTN; No CAD sx, NSR  Reproductive/Obstetrics                             Anesthesia Physical  Anesthesia Plan  ASA: II  Anesthesia Plan: General   Post-op Pain Management:    Induction: Intravenous  PONV Risk Score and Plan:   Airway Management Planned: Oral ETT  Additional Equipment:   Intra-op Plan:   Post-operative Plan: Extubation in OR  Informed Consent: I have reviewed the patients History and Physical, chart, labs and discussed the procedure including the risks, benefits and alternatives for the proposed anesthesia with the patient or authorized representative who has indicated his/her understanding and acceptance.     Dental Advisory Given and Dental advisory given  Plan Discussed with: CRNA and Surgeon  Anesthesia Plan Comments: (  Discussed general anesthesia, including possible nausea, instrumentation of airway, sore throat,pulmonary aspiration, etc. I asked if the were any outstanding questions, or  concerns before we proceeded. )        Anesthesia Quick Evaluation

## 2019-06-19 NOTE — Op Note (Signed)
Preoperative diagnosis: Adenocarcinoma of the prostate   Postoperative diagnosis: Adenocarcinoma the prostate  Procedure: I-125 prostate interstitial implant, cystoscopy  Surgeon: John Giovanni, M.D.   Radiation oncologist: Lavena Stanford, M.D.   Anesthesia: General  Drains: none  Complications: none  Indications: 68 y.o. male with low risk prostate cancer.  Confirmatory biopsy with low volume Gleason 3+3 however MRI showed evidence of capsular bulging.  He elected brachytherapy as definitive treatment.  Procedure: Patient was brought to operating suite and placement table in the supine position. At this time, a universal timeout protocol was performed, all team members were identified, Venodyne boots are placed, and he was administered IV antibiotic in the preoperative period. He was placed in lithotomy position and prepped and draped in usual manner. Radiation oncology department placed a transrectal ultrasound probe anchoring stand/ grid and aligned with previous imaging from the volume study. Foley catheter was inserted without difficulty.  All needle passage was done with real-time transrectal ultrasound guidance in both the transverse and sagittal plains in order to achieve the desired preplanned position. A total of 22 needles were placed.  75 active seeds were implanted. The Foley catheter was removed and a rigid cystoscopy failed to show any seeds outside the prostate without evidence of trauma to the urethral, prostatic fossa, or bladder.  The bladder was drained.  A fluoroscopic image was then obtained showing excellent distrubution of the brachytherapy seeds.  Each seed was counted and counts were correct.    The patient was then repositioned in the supine position, reversed from anesthesia, and taken to the PACU in stable condition.  John Giovanni, MD

## 2019-06-19 NOTE — Anesthesia Post-op Follow-up Note (Signed)
Anesthesia QCDR form completed.        

## 2019-06-19 NOTE — Transfer of Care (Signed)
Immediate Anesthesia Transfer of Care Note  Patient: Manuel Patel  Procedure(s) Performed: RADIOACTIVE SEED IMPLANT/BRACHYTHERAPY IMPLANT (N/A Prostate) CYSTOSCOPY (N/A Bladder)  Patient Location: PACU  Anesthesia Type:General  Level of Consciousness: awake, alert , oriented and patient cooperative  Airway & Oxygen Therapy: Patient Spontanous Breathing and Patient connected to face mask oxygen  Post-op Assessment: Report given to RN and Post -op Vital signs reviewed and stable  Post vital signs: Reviewed and stable  Last Vitals:  Vitals Value Taken Time  BP 128/89 06/19/19 0852  Temp    Pulse 62 06/19/19 0853  Resp 13 06/19/19 0853  SpO2 99 % 06/19/19 0853  Vitals shown include unvalidated device data.  Last Pain:  Vitals:   06/19/19 0629  TempSrc: Oral  PainSc: 0-No pain         Complications: No apparent anesthesia complications

## 2019-06-20 ENCOUNTER — Encounter: Payer: Self-pay | Admitting: Urology

## 2019-06-28 ENCOUNTER — Encounter: Payer: Self-pay | Admitting: Urology

## 2019-06-28 ENCOUNTER — Other Ambulatory Visit: Payer: Self-pay

## 2019-06-28 ENCOUNTER — Ambulatory Visit (INDEPENDENT_AMBULATORY_CARE_PROVIDER_SITE_OTHER): Payer: Federal, State, Local not specified - PPO | Admitting: Urology

## 2019-06-28 VITALS — BP 136/74 | HR 65 | Temp 99.9°F | Ht 70.0 in | Wt 210.0 lb

## 2019-06-28 DIAGNOSIS — N5089 Other specified disorders of the male genital organs: Secondary | ICD-10-CM | POA: Diagnosis not present

## 2019-06-28 DIAGNOSIS — C61 Malignant neoplasm of prostate: Secondary | ICD-10-CM

## 2019-06-28 DIAGNOSIS — N453 Epididymo-orchitis: Secondary | ICD-10-CM

## 2019-06-28 LAB — URINALYSIS, COMPLETE
Bilirubin, UA: NEGATIVE
Glucose, UA: NEGATIVE
Ketones, UA: NEGATIVE
Leukocytes,UA: NEGATIVE
Nitrite, UA: NEGATIVE
Protein,UA: NEGATIVE
Specific Gravity, UA: 1.025 (ref 1.005–1.030)
Urobilinogen, Ur: 0.2 mg/dL (ref 0.2–1.0)
pH, UA: 6 (ref 5.0–7.5)

## 2019-06-28 LAB — MICROSCOPIC EXAMINATION

## 2019-06-28 LAB — BLADDER SCAN AMB NON-IMAGING: Scan Result: 2

## 2019-06-28 MED ORDER — LEVOFLOXACIN 500 MG PO TABS: 500.0000 mg | ORAL_TABLET | Freq: Every day | ORAL | 0 refills | Status: DC

## 2019-06-28 NOTE — Progress Notes (Signed)
06/28/2019 7:11 PM   [name] August 17, 1950 [id number]  Referring provider: Albina Billet, MD [address]   [place],  [place] [address]  Chief Complaint  Patient presents with  . Testicle Pain    HPI: Mr. Manuel Patel is a 68 year old male who is s/p seed implant on 06/19/2019 with Dr. Bernardo Patel who presents today with symptoms of a swollen scrotum and perineal pain.  He has a history of 3 biopsy showing Gleason's 3+3 adenocarcinoma with the last to be fusion biopsies of P RADS 4/5 lesions.  There was capsular bulging that was suspicious for extraprostatic extension therefore he decided to undergo focal curative treatment.  He states after the seed implant he felt well, but three days later he started to felt awful and stayed in the bed.   He had a temperature of 102 over the weekend.  He was nauseous with some vomiting.  He also noted the scrotum started to swell over the weekend as well.   He is now with little appetite.  His fever is low grade at this time.  UA with trace blood, 0-5 WBCs, 0-2 RBCs, 0-10 epithelial cells and few bacteria.   PVR is 2 mL.  He is tolerating food and fluids.   He has not been sexually active.   PMH: Past Medical History:  Diagnosis Date  . Blind left eye   . Bronchitis   . Cancer [name]) 2019   prostate  . Glaucoma, right eye   . History of bronchitis   . History of kidney stones   . Hypertension   . Numbness and tingling in left arm     Surgical History: Past Surgical History:  Procedure Laterality Date  . ANKLE SURGERY Right   . ANKLE SURGERY    . ANTERIOR CERVICAL DECOMP/DISCECTOMY FUSION N/A 03/17/2016   Procedure: ANTERIOR CERVICAL DECOMPRESSION FUSION CERVICAL 3-4, CERVICAL 4-5, CERVICAL 5-6 WITH INSTRUMENTATION AND ALLOGRAFT;  Surgeon: Manuel Bob, MD;  Location: [facility];  Service: Orthopedics;  Laterality: N/A;  ANTERIOR CERVICAL DECOMPRESSION FUSION CERVICAL 3-4, CERVICAL 4-5, CERVICAL 5-6 WITH INSTRUMENTATION AND ALLOGRAFT  .  COLONOSCOPY    . COLONOSCOPY WITH PROPOFOL N/A 03/29/2018   Procedure: COLONOSCOPY WITH PROPOFOL;  Surgeon: Manuel Bellow, MD;  Location: [facility];  Service: Endoscopy;  Laterality: N/A;  . CYSTOSCOPY N/A 06/19/2019   Procedure: CYSTOSCOPY;  Surgeon: Manuel Sons, MD;  Location: [facility];  Service: Urology;  Laterality: N/A;  . EYE SURGERY Left    prosthetic left eye  . EYE SURGERY Right    cataract  . RADIOACTIVE SEED IMPLANT N/A 06/19/2019   Procedure: RADIOACTIVE SEED IMPLANT/BRACHYTHERAPY IMPLANT;  Surgeon: Manuel Sons, MD;  Location: [facility];  Service: Urology;  Laterality: N/A;    Home Medications:  Allergies as of 06/28/2019      Reactions   Penicillin V Rash   Did it involve swelling of the face/tongue/throat, SOB, or low BP? No Did it involve sudden or severe rash/hives, skin peeling, or any reaction on the inside of your mouth or nose? No Did you need to seek medical attention at a hospital or doctor's office? Unknown When did it last happen?childhood allergy  If all above answers are "NO", may proceed with cephalosporin use.   Pineapple Nausea Only      Medication List       Accurate as of June 28, 2019 11:59 PM. If you have any questions, ask your nurse or doctor.  amLODipine 10 MG tablet Commonly known as: NORVASC Take 10 mg by mouth at bedtime.   atenolol 50 MG tablet Commonly known as: TENORMIN Take 50 mg by mouth 2 (two) times daily.   fluticasone 50 MCG/ACT nasal spray Commonly known as: FLONASE Place 1 spray into both nostrils daily as needed for allergies or rhinitis.   ibuprofen 200 MG tablet Commonly known as: ADVIL Take 400 mg by mouth daily as needed for moderate pain.   levofloxacin 500 MG tablet Commonly known as: Levaquin Take 1 tablet (500 mg total) by mouth daily. Started by: Manuel Council, PA-C   multivitamin tablet Take 1 tablet by mouth 3 (three) times a week.   tamsulosin 0.4 MG Caps capsule  Commonly known as: FLOMAX Take 1 capsule (0.4 mg total) by mouth daily. What changed: when to take this   timolol 0.5 % ophthalmic gel-forming Commonly known as: TIMOPTIC-XR Place 1 drop into the right eye at bedtime.       Allergies:  Allergies  Allergen Reactions  . Penicillin V Rash    Did it involve swelling of the face/tongue/throat, SOB, or low BP? No Did it involve sudden or severe rash/hives, skin peeling, or any reaction on the inside of your mouth or nose? No Did you need to seek medical attention at a hospital or doctor's office? Unknown When did it last happen?childhood allergy  If all above answers are "NO", may proceed with cephalosporin use.   Marland Kitchen Pineapple Nausea Only    Family History: History reviewed. No pertinent family history.  Social History:  reports that he has never smoked. He has never used smokeless tobacco. He reports that he does not drink alcohol or use drugs.  ROS: UROLOGY Frequent Urination?: No Hard to postpone urination?: No Burning/pain with urination?: No Get up at night to urinate?: No Leakage of urine?: No Urine stream starts and stops?: No Trouble starting stream?: No Do you have to strain to urinate?: No Blood in urine?: No Urinary tract infection?: No Sexually transmitted disease?: No Injury to kidneys or bladder?: No Painful intercourse?: No Weak stream?: No Erection problems?: No Penile pain?: No  Gastrointestinal Nausea?: No Vomiting?: No Indigestion/heartburn?: No Diarrhea?: No Constipation?: No  Constitutional Fever: No Night sweats?: No Weight loss?: No Fatigue?: No  Skin Skin rash/lesions?: No Itching?: No  Eyes Blurred vision?: No Double vision?: No  Ears/Nose/Throat Sore throat?: No Sinus problems?: No  Hematologic/Lymphatic Swollen glands?: No Easy bruising?: No  Cardiovascular Leg swelling?: No Chest pain?: No  Respiratory Cough?: No Shortness of breath?: No  Endocrine  Excessive thirst?: No  Musculoskeletal Back pain?: No Joint pain?: No  Neurological Headaches?: No Dizziness?: No  Psychologic Depression?: No Anxiety?: No  Physical Exam: BP 136/74   Pulse 65   Temp 99.9 F (37.7 C)   Ht 5\' 10"  (1.778 m)   Wt 210 lb (95.3 kg)   BMI 30.13 kg/m   Constitutional:  Well nourished. Alert and oriented, No acute distress. HEENT: [place] AT, moist mucus membranes.  Trachea midline, no masses. Cardiovascular: No clubbing, cyanosis, or edema. Respiratory: Normal respiratory effort, no increased work of breathing. GI: Abdomen is soft, non tender, non distended, no abdominal masses. Liver and spleen not palpable.  No hernias appreciated.  Stool sample for occult testing is not indicated.   GU: No CVA tenderness.  No bladder fullness or masses.  Patient with uncircumcised phallus.  Foreskin easily retracted.  Urethral meatus is patent.  No penile discharge. No penile lesions or  rashes.  Right hemiscrotum with erythema, tenderness and swelling.  No crepitus or fluctuance is appreciated.  Right epididymis is indurated and tender and right testicle is tender.   Testicles are located scrotally bilaterally.  Left testicle and epididymis are normal. Rectal: Not performed.   Skin: No rashes, bruises or suspicious lesions. Lymph: No inguinal adenopathy. Neurologic: Grossly intact, no focal deficits, moving all 4 extremities. Psychiatric: Normal mood and affect.  Laboratory Data: Lab Results  Component Value Date   WBC 9.8 06/19/2019   HGB 14.7 06/19/2019   HCT 42.3 06/19/2019   MCV 83.9 06/19/2019   PLT 237 06/19/2019    Lab Results  Component Value Date   CREATININE 0.73 06/12/2019    No results found for: PSA  No results found for: TESTOSTERONE  No results found for: HGBA1C  No results found for: TSH  No results found for: CHOL, HDL, CHOLHDL, VLDL, LDLCALC  Lab Results  Component Value Date   AST 30 03/09/2016   Lab Results  Component Value  Date   ALT 22 03/09/2016   No components found for: ALKALINEPHOPHATASE No components found for: BILIRUBINTOTAL  No results found for: ESTRADIOL  Urinalysis Component     Latest Ref Rng & Units 06/28/2019  Specific Gravity, UA     1.005 - 1.030 1.025  pH, UA     5.0 - 7.5 6.0  Color, UA     Yellow Yellow  Appearance Ur     Clear Clear  Leukocytes,UA     Negative Negative  Protein,UA     Negative/Trace Negative  Glucose, UA     Negative Negative  Ketones, UA     Negative Negative  RBC, UA     Negative Trace (A)  Bilirubin, UA     Negative Negative  Urobilinogen, Ur     0.2 - 1.0 mg/dL 0.2  Nitrite, UA     Negative Negative  Microscopic Examination      See below:   Component     Latest Ref Rng & Units 06/28/2019  WBC, UA     0 - 5 /hpf 0-5  RBC     0 - 2 /hpf 0-2  Epithelial Cells (non renal)     0 - 10 /hpf 0-10  Bacteria, UA     None seen/Few Few    I have reviewed the labs.   Pertinent Imaging: Results for KATHLEEN, PRESCOTT" (MRN [id number]) as of 06/28/2019 13:25  Ref. Range 06/28/2019 13:15  Scan Result Unknown 2     Assessment & Plan:    1.  Right epididymitis/orchitis Start Levaquin 500 mg qd x 10 days UA benign but will send for culture Patient is advised that if they should start to experience pain that is not able to be controlled with pain medication, intractable nausea and/or vomiting and/or fevers greater than 103 or shaking chills to contact the office immediately or seek treatment in the emergency department for emergent intervention.    2. Prostate cancer S/P Seed Implant RTC as scheduled    Return for keep follow up appointment wtih me .  These notes generated with voice recognition software. I apologize for typographical errors.  Manuel Council, PA-C  [facility] [address]  [place], [place] [address] [phone]

## 2019-07-04 ENCOUNTER — Telehealth: Payer: Self-pay | Admitting: Family Medicine

## 2019-07-04 LAB — CULTURE, URINE COMPREHENSIVE

## 2019-07-04 NOTE — Telephone Encounter (Signed)
-----   Message from Nori Riis, PA-C sent at 07/04/2019 11:58 AM EST ----- Please let Mr. Durrer know that his urine culture was positive and he is on the correct antibiotic for his infection.  He needs to finish the Levaquin.

## 2019-07-04 NOTE — Telephone Encounter (Signed)
Patient notified and voiced understanding.

## 2019-07-16 ENCOUNTER — Telehealth: Payer: Self-pay

## 2019-07-16 NOTE — Telephone Encounter (Signed)
Called pt he states that he is feeling much better, he has had no problems voiding, and no pain. Advised pt to call back if sx worsen or change. Pt gave verbal understanding.

## 2019-07-17 ENCOUNTER — Other Ambulatory Visit: Payer: Self-pay

## 2019-07-18 ENCOUNTER — Other Ambulatory Visit: Payer: Self-pay | Admitting: *Deleted

## 2019-07-18 ENCOUNTER — Other Ambulatory Visit: Payer: Self-pay

## 2019-07-18 ENCOUNTER — Ambulatory Visit
Admission: RE | Admit: 2019-07-18 | Discharge: 2019-07-18 | Disposition: A | Discharge status: Home / Self Care | Payer: Federal, State, Local not specified - PPO | Source: Ambulatory Visit | Attending: Radiation Oncology | Admitting: Radiation Oncology

## 2019-07-18 ENCOUNTER — Encounter: Payer: Self-pay | Admitting: Radiation Oncology

## 2019-07-18 VITALS — BP 144/66 | HR 52 | Temp 96.8°F | Resp 18 | Wt 216.8 lb

## 2019-07-18 DIAGNOSIS — C61 Malignant neoplasm of prostate: Secondary | ICD-10-CM | POA: Insufficient documentation

## 2019-07-18 DIAGNOSIS — R351 Nocturia: Secondary | ICD-10-CM | POA: Diagnosis not present

## 2019-07-18 DIAGNOSIS — R350 Frequency of micturition: Secondary | ICD-10-CM | POA: Diagnosis not present

## 2019-07-18 DIAGNOSIS — Z923 Personal history of irradiation: Secondary | ICD-10-CM | POA: Diagnosis not present

## 2019-07-18 NOTE — Progress Notes (Signed)
Radiation Oncology Follow up Note  Name: Manuel Patel   Date:   07/18/2019 MRN:  [id number] DOB: 02-09-1951    This 68 y.o. male presents to the clinic today for 1 month follow-up status post I-125 interstitial implant for Gleason 6 (3+3) adenocarcinoma presenting with a PSA of 6.  REFERRING PROVIDER: Albina Billet, MD  HPI: Patient is a 68 year old male now at 1 month having completed I-125 interstitial implant for Gleason 6 (3+3) adenocarcinoma the prostate.  Seen today in routine follow-up he is doing well.  Had some problems shortly after implant including what sounds like epididymitis treated with antibiotic therapy.  Also patient had the flu shortly after implant.  He is still having some urinary frequency urgency and nocturia x3-4 he is currently on Flomax.  He is seen today in routine follow-up..  COMPLICATIONS OF TREATMENT: none  FOLLOW UP COMPLIANCE: keeps appointments   PHYSICAL EXAM:  BP (!) 144/66   Pulse (!) 52   Temp (!) 96.8 F (36 C) (Tympanic)   Resp 18   Wt 216 lb 12.8 oz (98.3 kg)   BMI 31.11 kg/m  Well-developed well-nourished patient in NAD. HEENT reveals PERLA, EOMI, discs not visualized.  Oral cavity is clear. No oral mucosal lesions are identified. Neck is clear without evidence of cervical or supraclavicular adenopathy. Lungs are clear to A&P. Cardiac examination is essentially unremarkable with regular rate and rhythm without murmur rub or thrill. Abdomen is benign with no organomegaly or masses noted. Motor sensory and DTR levels are equal and symmetric in the upper and lower extremities. Cranial nerves II through XII are grossly intact. Proprioception is intact. No peripheral adenopathy or edema is identified. No motor or sensory levels are noted. Crude visual fields are within normal range.  RADIOLOGY RESULTS: CT scan for quality assurance performed on initial review shows excellent placement of sources  PLAN: Present time patient is doing well seems that  his urinary side effects seem to be improving over time.  I have emphasized he is still slightly radioactive from his seed implant to still observe radiation safety precautions.  I have otherwise asked to see him back in 3 months for follow-up with a PSA prior to that visit.  Patient comprehends my recommendations well.  I would like to take this opportunity to thank you for allowing me to participate in the care of your patient.Noreene Filbert, MD

## 2019-07-19 DIAGNOSIS — C61 Malignant neoplasm of prostate: Secondary | ICD-10-CM | POA: Diagnosis not present

## 2019-07-23 DIAGNOSIS — C61 Malignant neoplasm of prostate: Secondary | ICD-10-CM | POA: Diagnosis not present

## 2019-07-26 ENCOUNTER — Ambulatory Visit: Payer: Federal, State, Local not specified - PPO | Admitting: Urology

## 2019-08-08 NOTE — Progress Notes (Deleted)
08/09/2019 10:07 PM   [place] [birth year] [id number]  Referring provider: Albina Billet, MD [address]   [place],  [place] [address]  No chief complaint on file.   HPI: Mr. Wisman is a 68 year old male who is s/p seed implant on 06/19/2019 with Dr. Bernardo Heater and epididymitis who presents today for follow up.  He has a history of 3 biopsy showing Gleason's 3+3 adenocarcinoma with the last to be fusion biopsies of P RADS 4/5 lesions.  There was capsular bulging that was suspicious for extraprostatic extension therefore he decided to undergo focal curative treatment.  Presented to the office on 06/28/2019 with epididymitis/orchitis with + urine culture with E. Coli and Pseudomonas treated appropriately with Levaquin.    PMH: Past Medical History:  Diagnosis Date  . Blind left eye   . Bronchitis   . Cancer [facility] 2019   prostate  . Glaucoma, right eye   . History of bronchitis   . History of kidney stones   . Hypertension   . Numbness and tingling in left arm     Surgical History: Past Surgical History:  Procedure Laterality Date  . ANKLE SURGERY Right   . ANKLE SURGERY    . ANTERIOR CERVICAL DECOMP/DISCECTOMY FUSION N/A 03/17/2016   Procedure: ANTERIOR CERVICAL DECOMPRESSION FUSION CERVICAL 3-4, CERVICAL 4-5, CERVICAL 5-6 WITH INSTRUMENTATION AND ALLOGRAFT;  Surgeon: Phylliss Bob, MD;  Location: [facility];  Service: Orthopedics;  Laterality: N/A;  ANTERIOR CERVICAL DECOMPRESSION FUSION CERVICAL 3-4, CERVICAL 4-5, CERVICAL 5-6 WITH INSTRUMENTATION AND ALLOGRAFT  . COLONOSCOPY    . COLONOSCOPY WITH PROPOFOL N/A 03/29/2018   Procedure: COLONOSCOPY WITH PROPOFOL;  Surgeon: Robert Bellow, MD;  Location: [facility];  Service: Endoscopy;  Laterality: N/A;  . CYSTOSCOPY N/A 06/19/2019   Procedure: CYSTOSCOPY;  Surgeon: Abbie Sons, MD;  Location: [facility];  Service: Urology;  Laterality: N/A;  . EYE SURGERY Left    prosthetic left eye  . EYE SURGERY Right    cataract  . RADIOACTIVE SEED IMPLANT N/A 06/19/2019   Procedure: RADIOACTIVE SEED IMPLANT/BRACHYTHERAPY IMPLANT;  Surgeon: Abbie Sons, MD;  Location: [facility];  Service: Urology;  Laterality: N/A;    Home Medications:  Allergies as of 08/09/2019      Reactions   Penicillin V Rash   Did it involve swelling of the face/tongue/throat, SOB, or low BP? No Did it involve sudden or severe rash/hives, skin peeling, or any reaction on the inside of your mouth or nose? No Did you need to seek medical attention at a hospital or doctor's office? Unknown When did it last happen?childhood allergy  If all above answers are "NO", may proceed with cephalosporin use.   Pineapple Nausea Only      Medication List       Accurate as of August 08, 2019 10:07 PM. If you have any questions, ask your nurse or doctor.        amLODipine 10 MG tablet Commonly known as: NORVASC Take 10 mg by mouth at bedtime.   atenolol 50 MG tablet Commonly known as: TENORMIN Take 50 mg by mouth 2 (two) times daily.   fluticasone 50 MCG/ACT nasal spray Commonly known as: FLONASE Place 1 spray into both nostrils daily as needed for allergies or rhinitis.   ibuprofen 200 MG tablet Commonly known as: ADVIL Take 400 mg by mouth daily as needed for moderate pain.   multivitamin tablet Take 1 tablet by mouth 3 (three) times  a week.   tamsulosin 0.4 MG Caps capsule Commonly known as: FLOMAX Take 1 capsule (0.4 mg total) by mouth daily. What changed: when to take this   timolol 0.5 % ophthalmic gel-forming Commonly known as: TIMOPTIC-XR Place 1 drop into the right eye at bedtime.       Allergies:  Allergies  Allergen Reactions  . Penicillin V Rash    Did it involve swelling of the face/tongue/throat, SOB, or low BP? No Did it involve sudden or severe rash/hives, skin peeling, or any reaction on the inside of your mouth or nose? No Did you need to seek medical attention at a hospital or doctor's  office? Unknown When did it last happen?childhood allergy  If all above answers are "NO", may proceed with cephalosporin use.   Marland Kitchen Pineapple Nausea Only    Family History: No family history on file.  Social History:  reports that he has never smoked. He has never used smokeless tobacco. He reports that he does not drink alcohol or use drugs.  ROS:                                        Physical Exam: There were no vitals taken for this visit.  Constitutional:  Well nourished. Alert and oriented, No acute distress. HEENT: [place] AT, moist mucus membranes.  Trachea midline, no masses. Cardiovascular: No clubbing, cyanosis, or edema. Respiratory: Normal respiratory effort, no increased work of breathing. GI: Abdomen is soft, non tender, non distended, no abdominal masses. Liver and spleen not palpable.  No hernias appreciated.  Stool sample for occult testing is not indicated.   GU: No CVA tenderness.  No bladder fullness or masses.  Patient with circumcised/uncircumcised phallus. ***Foreskin easily retracted***  Urethral meatus is patent.  No penile discharge. No penile lesions or rashes. Scrotum without lesions, cysts, rashes and/or edema.  Testicles are located scrotally bilaterally. No masses are appreciated in the testicles. Left and right epididymis are normal. Rectal: Patient with  normal sphincter tone. Anus and perineum without scarring or rashes. No rectal masses are appreciated. Prostate is approximately *** grams, *** nodules are appreciated. Seminal vesicles are normal. Skin: No rashes, bruises or suspicious lesions. Lymph: No cervical or inguinal adenopathy. Neurologic: Grossly intact, no focal deficits, moving all 4 extremities. Psychiatric: Normal mood and affect.  Laboratory Data: Lab Results  Component Value Date   WBC 9.8 06/19/2019   HGB 14.7 06/19/2019   HCT 42.3 06/19/2019   MCV 83.9 06/19/2019   PLT 237 06/19/2019    Lab Results    Component Value Date   CREATININE 0.73 06/12/2019    No results found for: PSA  No results found for: TESTOSTERONE  No results found for: HGBA1C  No results found for: TSH  No results found for: CHOL, HDL, CHOLHDL, VLDL, LDLCALC  Lab Results  Component Value Date   AST 30 03/09/2016   Lab Results  Component Value Date   ALT 22 03/09/2016   No components found for: ALKALINEPHOPHATASE No components found for: BILIRUBINTOTAL  No results found for: ESTRADIOL  Urinalysis Component     Latest Ref Rng & Units 06/28/2019  Specific Gravity, UA     1.005 - 1.030 1.025  pH, UA     5.0 - 7.5 6.0  Color, UA     Yellow Yellow  Appearance Ur     Clear Clear  Leukocytes,UA  Negative Negative  Protein,UA     Negative/Trace Negative  Glucose, UA     Negative Negative  Ketones, UA     Negative Negative  RBC, UA     Negative Trace (A)  Bilirubin, UA     Negative Negative  Urobilinogen, Ur     0.2 - 1.0 mg/dL 0.2  Nitrite, UA     Negative Negative  Microscopic Examination      See below:   Component     Latest Ref Rng & Units 06/28/2019  WBC, UA     0 - 5 /hpf 0-5  RBC     0 - 2 /hpf 0-2  Epithelial Cells (non renal)     0 - 10 /hpf 0-10  Bacteria, UA     None seen/Few Few    I have reviewed the labs.   Pertinent Imaging: Results for CLARY, EDEL" (MRN [id number]) as of 06/28/2019 13:25  Ref. Range 06/28/2019 13:15  Scan Result Unknown 2     Assessment & Plan:    1.  Right epididymitis/orchitis Start Levaquin 500 mg qd x 10 days UA benign but will send for culture Patient is advised that if they should start to experience pain that is not able to be controlled with pain medication, intractable nausea and/or vomiting and/or fevers greater than 103 or shaking chills to contact the office immediately or seek treatment in the emergency department for emergent intervention.    2. Prostate cancer S/P Seed Implant 10/18/2019 with Dr. Crystals     No follow-ups on file.  These notes generated with voice recognition software. I apologize for typographical errors.  [name], PA-C  [facility] [address]  [place], [place] [address] [phone]

## 2019-08-09 ENCOUNTER — Ambulatory Visit: Payer: Federal, State, Local not specified - PPO | Admitting: Urology

## 2019-08-20 NOTE — Progress Notes (Signed)
08/21/2019 2:33 PM   [place] 09-14-1950 [id number]  Referring provider: Albina Billet, MD [address]   [place],  [place] [address]  Chief Complaint  Patient presents with  . Follow-up    HPI: Mr. Mathiesen is a 69 year old male who is s/p seed implant on 06/19/2019 with Dr. Bernardo Heater and epididymitis who presents today for follow up.  He has a history of 3 biopsy cores showing Gleason's 3+3 adenocarcinoma with the last to be fusion biopsies of P RADS 4/5 lesions.  There was capsular bulging that was suspicious for extraprostatic extension therefore he decided to undergo focal curative treatment.  Presented to the office on 06/28/2019 with epididymitis/orchitis with + urine culture with E. Coli and Pseudomonas treated appropriately with Levaquin.    He is now 2 months out having completed the I-125 interstitial implant.    He states after a few days of the antibiotic, he started to feel better.  He finished the Levaquin and feels 100% better.  Patient denies any gross hematuria, dysuria or suprapubic/flank pain.  Patient denies any fevers, chills, nausea or vomiting.   PMH: Past Medical History:  Diagnosis Date  . Blind left eye   . Bronchitis   . Cancer [facility]) 2019   prostate  . Glaucoma, right eye   . History of bronchitis   . History of kidney stones   . Hypertension   . Numbness and tingling in left arm     Surgical History: Past Surgical History:  Procedure Laterality Date  . ANKLE SURGERY Right   . ANKLE SURGERY    . ANTERIOR CERVICAL DECOMP/DISCECTOMY FUSION N/A 03/17/2016   Procedure: ANTERIOR CERVICAL DECOMPRESSION FUSION CERVICAL 3-4, CERVICAL 4-5, CERVICAL 5-6 WITH INSTRUMENTATION AND ALLOGRAFT;  Surgeon: Phylliss Bob, MD;  Location: [place];  Service: Orthopedics;  Laterality: N/A;  ANTERIOR CERVICAL DECOMPRESSION FUSION CERVICAL 3-4, CERVICAL 4-5, CERVICAL 5-6 WITH INSTRUMENTATION AND ALLOGRAFT  . COLONOSCOPY    . COLONOSCOPY WITH PROPOFOL N/A 03/29/2018    Procedure: COLONOSCOPY WITH PROPOFOL;  Surgeon: Robert Bellow, MD;  Location: [facility];  Service: Endoscopy;  Laterality: N/A;  . CYSTOSCOPY N/A 06/19/2019   Procedure: CYSTOSCOPY;  Surgeon: Abbie Sons, MD;  Location: [facility];  Service: Urology;  Laterality: N/A;  . EYE SURGERY Left    prosthetic left eye  . EYE SURGERY Right    cataract  . RADIOACTIVE SEED IMPLANT N/A 06/19/2019   Procedure: RADIOACTIVE SEED IMPLANT/BRACHYTHERAPY IMPLANT;  Surgeon: Abbie Sons, MD;  Location: [facility];  Service: Urology;  Laterality: N/A;    Home Medications:  Allergies as of 08/21/2019      Reactions   Penicillin V Rash   Did it involve swelling of the face/tongue/throat, SOB, or low BP? No Did it involve sudden or severe rash/hives, skin peeling, or any reaction on the inside of your mouth or nose? No Did you need to seek medical attention at a hospital or doctor's office? Unknown When did it last happen?childhood allergy  If all above answers are "NO", may proceed with cephalosporin use.   Pineapple Nausea Only      Medication List       Accurate as of August 21, 2019  2:33 PM. If you have any questions, ask your nurse or doctor.        amLODipine 10 MG tablet Commonly known as: NORVASC Take 10 mg by mouth at bedtime.   atenolol 50 MG tablet Commonly known as:  TENORMIN Take 50 mg by mouth 2 (two) times daily.   cefdinir 300 MG capsule Commonly known as: OMNICEF   fluticasone 50 MCG/ACT nasal spray Commonly known as: FLONASE Place 1 spray into both nostrils daily as needed for allergies or rhinitis.   ibuprofen 200 MG tablet Commonly known as: ADVIL Take 400 mg by mouth daily as needed for moderate pain.   multivitamin tablet Take 1 tablet by mouth 3 (three) times a week.   tamsulosin 0.4 MG Caps capsule Commonly known as: FLOMAX Take 1 capsule (0.4 mg total) by mouth daily. What changed: when to take this   timolol 0.5 % ophthalmic  gel-forming Commonly known as: TIMOPTIC-XR Place 1 drop into the right eye at bedtime.       Allergies:  Allergies  Allergen Reactions  . Penicillin V Rash    Did it involve swelling of the face/tongue/throat, SOB, or low BP? No Did it involve sudden or severe rash/hives, skin peeling, or any reaction on the inside of your mouth or nose? No Did you need to seek medical attention at a hospital or doctor's office? Unknown When did it last happen?childhood allergy  If all above answers are "NO", may proceed with cephalosporin use.   Marland Kitchen Pineapple Nausea Only    Family History: History reviewed. No pertinent family history.  Social History:  reports that he has never smoked. He has never used smokeless tobacco. He reports that he does not drink alcohol or use drugs.  ROS: UROLOGY Frequent Urination?: No Hard to postpone urination?: No Burning/pain with urination?: No Get up at night to urinate?: No Leakage of urine?: No Urine stream starts and stops?: No Trouble starting stream?: No Do you have to strain to urinate?: No Blood in urine?: No Urinary tract infection?: No Sexually transmitted disease?: No Injury to kidneys or bladder?: No Painful intercourse?: No Weak stream?: No Erection problems?: No Penile pain?: No  Gastrointestinal Nausea?: No Vomiting?: No Indigestion/heartburn?: No Diarrhea?: No Constipation?: No  Constitutional Fever: No Night sweats?: No Weight loss?: No Fatigue?: No  Skin Skin rash/lesions?: No Itching?: No  Eyes Blurred vision?: No Double vision?: No  Ears/Nose/Throat Sore throat?: No Sinus problems?: No  Hematologic/Lymphatic Swollen glands?: No Easy bruising?: No  Cardiovascular Leg swelling?: No Chest pain?: No  Respiratory Cough?: No Shortness of breath?: No  Endocrine Excessive thirst?: No  Musculoskeletal Back pain?: No Joint pain?: No  Neurological Headaches?: No Dizziness?:  No  Psychologic Depression?: No Anxiety?: No  Physical Exam: BP 138/76   Pulse (!) 55   Ht 5\' 10"  (1.778 m)   Wt 210 lb (95.3 kg)   BMI 30.13 kg/m   Constitutional:  Well nourished. Alert and oriented, No acute distress. HEENT: [place] AT, moist mucus membranes.  Trachea midline, no masses. Cardiovascular: No clubbing, cyanosis, or edema. Respiratory: Normal respiratory effort, no increased work of breathing. GI: Abdomen is soft, non tender, non distended, no abdominal masses. Liver and spleen not palpable.  No hernias appreciated.  Stool sample for occult testing is not indicated.   GU: No CVA tenderness.  No bladder fullness or masses.  Patient with uncircumcised phallus.  Foreskin easily retracted Urethral meatus is patent.  No penile discharge. No penile lesions or rashes. Scrotum without lesions, cysts, rashes and/or edema.  Testicles are located scrotally bilaterally. No masses are appreciated in the testicles. Left and right epididymis are normal. Rectal: Not performed Skin: No rashes, bruises or suspicious lesions. Lymph: No inguinal adenopathy. Neurologic: Grossly intact, no  focal deficits, moving all 4 extremities. Psychiatric: Normal mood and affect.  Laboratory Data: Lab Results  Component Value Date   WBC 9.8 06/19/2019   HGB 14.7 06/19/2019   HCT 42.3 06/19/2019   MCV 83.9 06/19/2019   PLT 237 06/19/2019    Lab Results  Component Value Date   CREATININE 0.73 06/12/2019    No results found for: PSA  No results found for: TESTOSTERONE  No results found for: HGBA1C  No results found for: TSH  No results found for: CHOL, HDL, CHOLHDL, VLDL, LDLCALC  Lab Results  Component Value Date   AST 30 03/09/2016   Lab Results  Component Value Date   ALT 22 03/09/2016   No components found for: ALKALINEPHOPHATASE No components found for: BILIRUBINTOTAL  No results found for: ESTRADIOL  Urinalysis Component     Latest Ref Rng & Units 06/28/2019  Specific  Gravity, UA     1.005 - 1.030 1.025  pH, UA     5.0 - 7.5 6.0  Color, UA     Yellow Yellow  Appearance Ur     Clear Clear  Leukocytes,UA     Negative Negative  Protein,UA     Negative/Trace Negative  Glucose, UA     Negative Negative  Ketones, UA     Negative Negative  RBC, UA     Negative Trace (A)  Bilirubin, UA     Negative Negative  Urobilinogen, Ur     0.2 - 1.0 mg/dL 0.2  Nitrite, UA     Negative Negative  Microscopic Examination      See below:   Component     Latest Ref Rng & Units 06/28/2019  WBC, UA     0 - 5 /hpf 0-5  RBC     0 - 2 /hpf 0-2  Epithelial Cells (non renal)     0 - 10 /hpf 0-10  Bacteria, UA     None seen/Few Few    I have reviewed the labs.   Pertinent Imaging: Results for FILOMENO, FRILOT" (MRN [id number]) as of 06/28/2019 13:25  Ref. Range 06/28/2019 13:15  Scan Result Unknown 2     Assessment & Plan:    1.  Right epididymitis/orchitis Resolved   2. Prostate cancer S/P Seed Implant 10/18/2019 with Dr. Donella Stade    Return for patient has a follow up with Dr. Donella Stade in March .  These notes generated with voice recognition software. I apologize for typographical errors.  [name], PA-C  [facility] [address]  [place], [place] [address] [phone]

## 2019-08-21 ENCOUNTER — Encounter: Payer: Self-pay | Admitting: Urology

## 2019-08-21 ENCOUNTER — Ambulatory Visit (INDEPENDENT_AMBULATORY_CARE_PROVIDER_SITE_OTHER): Payer: Federal, State, Local not specified - PPO | Admitting: Urology

## 2019-08-21 ENCOUNTER — Other Ambulatory Visit: Payer: Self-pay

## 2019-08-21 VITALS — BP 138/76 | HR 55 | Ht 70.0 in | Wt 210.0 lb

## 2019-08-21 DIAGNOSIS — N453 Epididymo-orchitis: Secondary | ICD-10-CM

## 2019-08-21 DIAGNOSIS — C61 Malignant neoplasm of prostate: Secondary | ICD-10-CM

## 2019-09-15 ENCOUNTER — Ambulatory Visit: Payer: Federal, State, Local not specified - PPO | Attending: Internal Medicine

## 2019-09-15 VITALS — BP 130/78 | HR 53 | Temp 97.7°F | Resp 12

## 2019-09-15 DIAGNOSIS — Z23 Encounter for immunization: Secondary | ICD-10-CM

## 2019-09-15 NOTE — Progress Notes (Signed)
   Covid-19 Vaccination Clinic  Name:  Manuel Patel    MRN: [id number] DOB: Feb 22, 1951  09/15/2019  [name] was observed post Covid-19 immunization for 15 minutes with incidence.  Patient developed mild pruritis of left upper arm. Was evaluated. Vital signs taken. Symptoms self-resolved within five minutes. Patient declined any medication including Claritin, Pepcid and/or Benadryl.  Patient did not develop any swelling of face, dyspnea, wheezing, or rash/urticaria. He was provided with Vaccine Information Sheet and instruction to access the V-Safe system.   [name] was instructed to call 911 with any severe reactions post vaccine: Marland Kitchen Difficulty breathing  . Swelling of your face and throat  . A fast heartbeat  . A bad rash all over your body  . Dizziness and weakness    Immunizations Administered    Name Date Dose VIS Date Route   Pfizer COVID-19 Vaccine 09/15/2019  9:47 AM 0.3 mL 07/20/2019 Intramuscular   Manufacturer: [place]   Lot: [id number]   [place]: [id number]

## 2019-09-17 ENCOUNTER — Ambulatory Visit: Payer: Federal, State, Local not specified - PPO

## 2019-10-09 ENCOUNTER — Ambulatory Visit: Payer: Federal, State, Local not specified - PPO | Attending: Internal Medicine

## 2019-10-09 DIAGNOSIS — Z23 Encounter for immunization: Secondary | ICD-10-CM | POA: Insufficient documentation

## 2019-10-09 NOTE — Progress Notes (Signed)
   Covid-19 Vaccination Clinic  Name:  Aundrae Bator    MRN: [id number] DOB: 1950-11-24  10/09/2019  Mr. Sana was observed post Covid-19 immunization for 15 minutes without incident. He was provided with Vaccine Information Sheet and instruction to access the V-Safe system.   Mr. Martell was instructed to call 911 with any severe reactions post vaccine: Marland Kitchen Difficulty breathing  . Swelling of face and throat  . A fast heartbeat  . A bad rash all over body  . Dizziness and weakness   Immunizations Administered    Name Date Dose VIS Date Route   Pfizer COVID-19 Vaccine 10/09/2019  8:48 AM 0.3 mL 07/20/2019 Intramuscular   Manufacturer: [place]   Lot: [id number]   [place]: [id number]

## 2019-10-11 ENCOUNTER — Other Ambulatory Visit: Payer: Self-pay

## 2019-10-11 ENCOUNTER — Inpatient Hospital Stay: Payer: Federal, State, Local not specified - PPO | Attending: Radiation Oncology

## 2019-10-11 DIAGNOSIS — C61 Malignant neoplasm of prostate: Secondary | ICD-10-CM | POA: Insufficient documentation

## 2019-10-11 LAB — PSA: Prostatic Specific Antigen: 0.61 ng/mL (ref 0.00–4.00)

## 2019-10-18 ENCOUNTER — Ambulatory Visit: Payer: Federal, State, Local not specified - PPO | Admitting: Radiation Oncology

## 2019-10-25 ENCOUNTER — Encounter: Payer: Self-pay | Admitting: Radiation Oncology

## 2019-10-25 ENCOUNTER — Ambulatory Visit
Admission: RE | Admit: 2019-10-25 | Discharge: 2019-10-25 | Disposition: A | Discharge status: Home / Self Care | Payer: Federal, State, Local not specified - PPO | Source: Ambulatory Visit | Attending: Radiation Oncology | Admitting: Radiation Oncology

## 2019-10-25 VITALS — BP 123/74 | HR 53 | Temp 97.5°F | Resp 16 | Wt 216.8 lb

## 2019-10-25 DIAGNOSIS — C61 Malignant neoplasm of prostate: Secondary | ICD-10-CM

## 2019-10-25 DIAGNOSIS — Z923 Personal history of irradiation: Secondary | ICD-10-CM | POA: Diagnosis not present

## 2019-10-25 NOTE — Progress Notes (Signed)
Radiation Oncology Follow up Note  Name: Manuel Patel   Date:   10/25/2019 MRN:  [id number] DOB: [birth year]    This 69 y.o. male presents to the clinic today for 43-month follow-up status post I-125 interstitial implant for Gleason 6 adenocarcinoma the prostate presenting with a PSA of 6.  REFERRING PROVIDER: Albina Billet, MD  HPI: Patient is a 68 year old male now out 4 months having completed I-125 interstitial implant for a Gleason 6 adenocarcinoma the prostate.  Seen today in routine follow-up he is doing well.  He specifically denies any increased lower urinary tract symptoms diarrhea or fatigue.  His most recent PSA is 0.6.Marland Kitchen  COMPLICATIONS OF TREATMENT: none  FOLLOW UP COMPLIANCE: keeps appointments   PHYSICAL EXAM:  BP 123/74   Pulse (!) 53   Temp (!) 97.5 F (36.4 C) (Tympanic)   Resp 16   Wt 216 lb 12.8 oz (98.3 kg)   BMI 31.11 kg/m  Well-developed well-nourished patient in NAD. HEENT reveals PERLA, EOMI, discs not visualized.  Oral cavity is clear. No oral mucosal lesions are identified. Neck is clear without evidence of cervical or supraclavicular adenopathy. Lungs are clear to A&P. Cardiac examination is essentially unremarkable with regular rate and rhythm without murmur rub or thrill. Abdomen is benign with no organomegaly or masses noted. Motor sensory and DTR levels are equal and symmetric in the upper and lower extremities. Cranial nerves II through XII are grossly intact. Proprioception is intact. No peripheral adenopathy or edema is identified. No motor or sensory levels are noted. Crude visual fields are within normal range.  RADIOLOGY RESULTS: No current films to review  PLAN: Present time patient is doing well under excellent biochemical control of his prostate cancer.  I am pleased with his overall progress.  I have asked to see him back in 6 months with a PSA at that time.  Patient knows to call sooner with any concerns.  I would like to take this opportunity  to thank you for allowing me to participate in the care of your patient.Noreene Filbert, MD

## 2019-12-29 ENCOUNTER — Other Ambulatory Visit: Payer: Self-pay | Admitting: Urology

## 2019-12-29 DIAGNOSIS — C61 Malignant neoplasm of prostate: Secondary | ICD-10-CM

## 2019-12-31 ENCOUNTER — Other Ambulatory Visit: Payer: Self-pay | Admitting: *Deleted

## 2019-12-31 ENCOUNTER — Telehealth: Payer: Self-pay | Admitting: Urology

## 2019-12-31 DIAGNOSIS — C61 Malignant neoplasm of prostate: Secondary | ICD-10-CM

## 2019-12-31 MED ORDER — TAMSULOSIN HCL 0.4 MG PO CAPS: 0.4000 mg | ORAL_CAPSULE | Freq: Every day | ORAL | 6 refills | Status: DC

## 2019-12-31 NOTE — Telephone Encounter (Signed)
Pt needs a refill of tamsulosin (FLOMAX) 0.4 MG CAPS  Sent to [facility]

## 2020-01-05 IMAGING — CR LEFT WRIST - COMPLETE 3+ VIEW
1 series · 4 of 4 positions shown · non-contrast
Comparison: None

CLINICAL DATA: Progressively worsening BILATERAL hand and wrist
pain for 2 months, pain and swelling in all fingers, remote RIGHT
thumb injury 40 years ago

EXAM:
LEFT WRIST - COMPLETE 3+ VIEW

[Series 1: dg wrist complete left · 0.14mm/px · 4 of 4 slices shown]
[im 1/4]
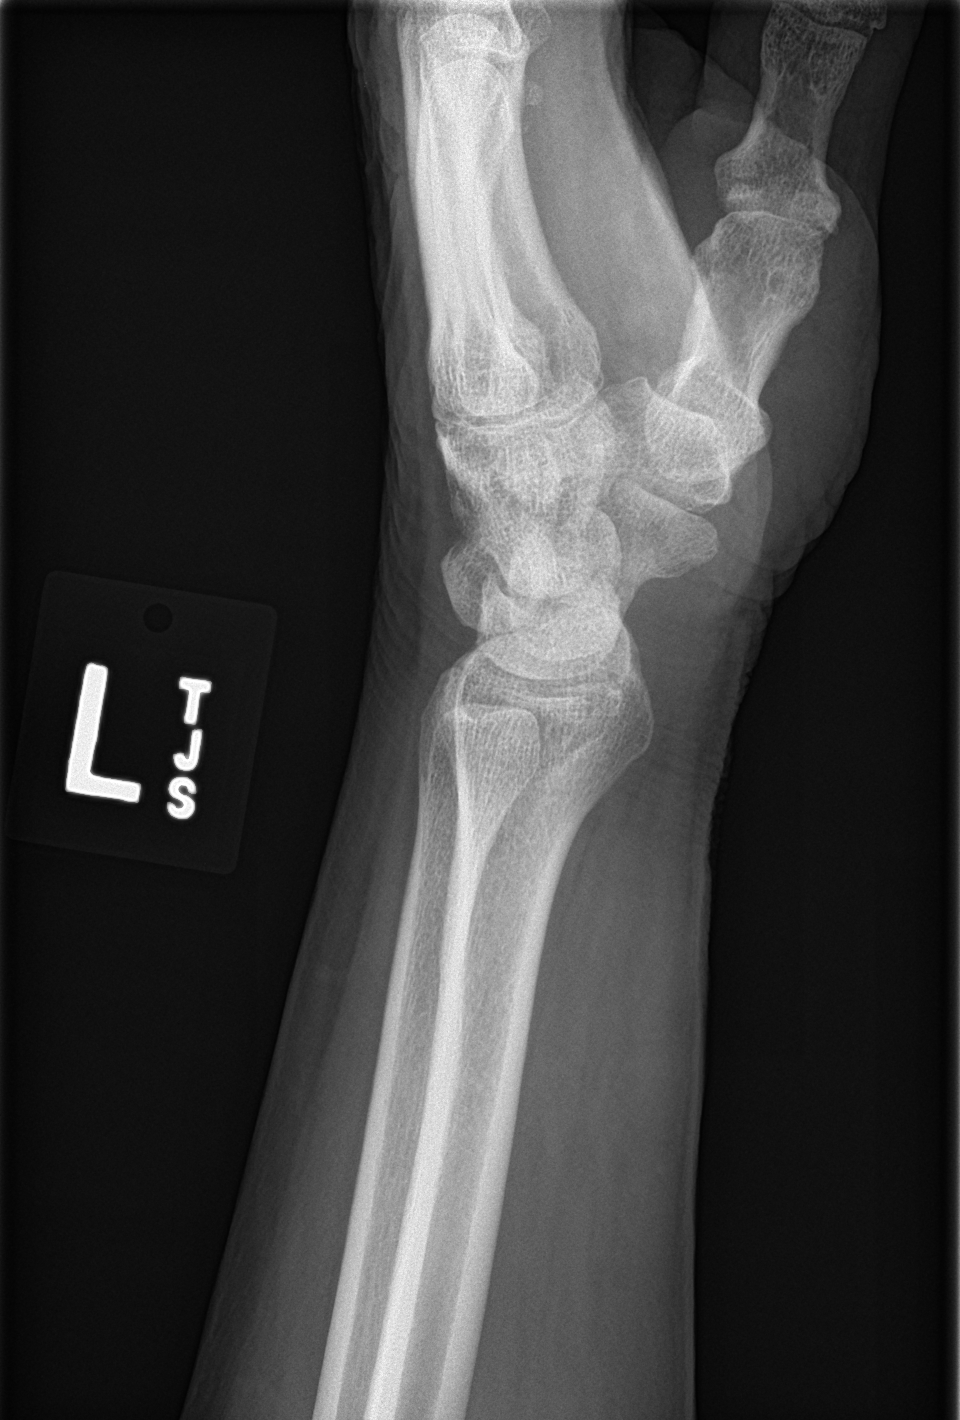
[im 2/4]
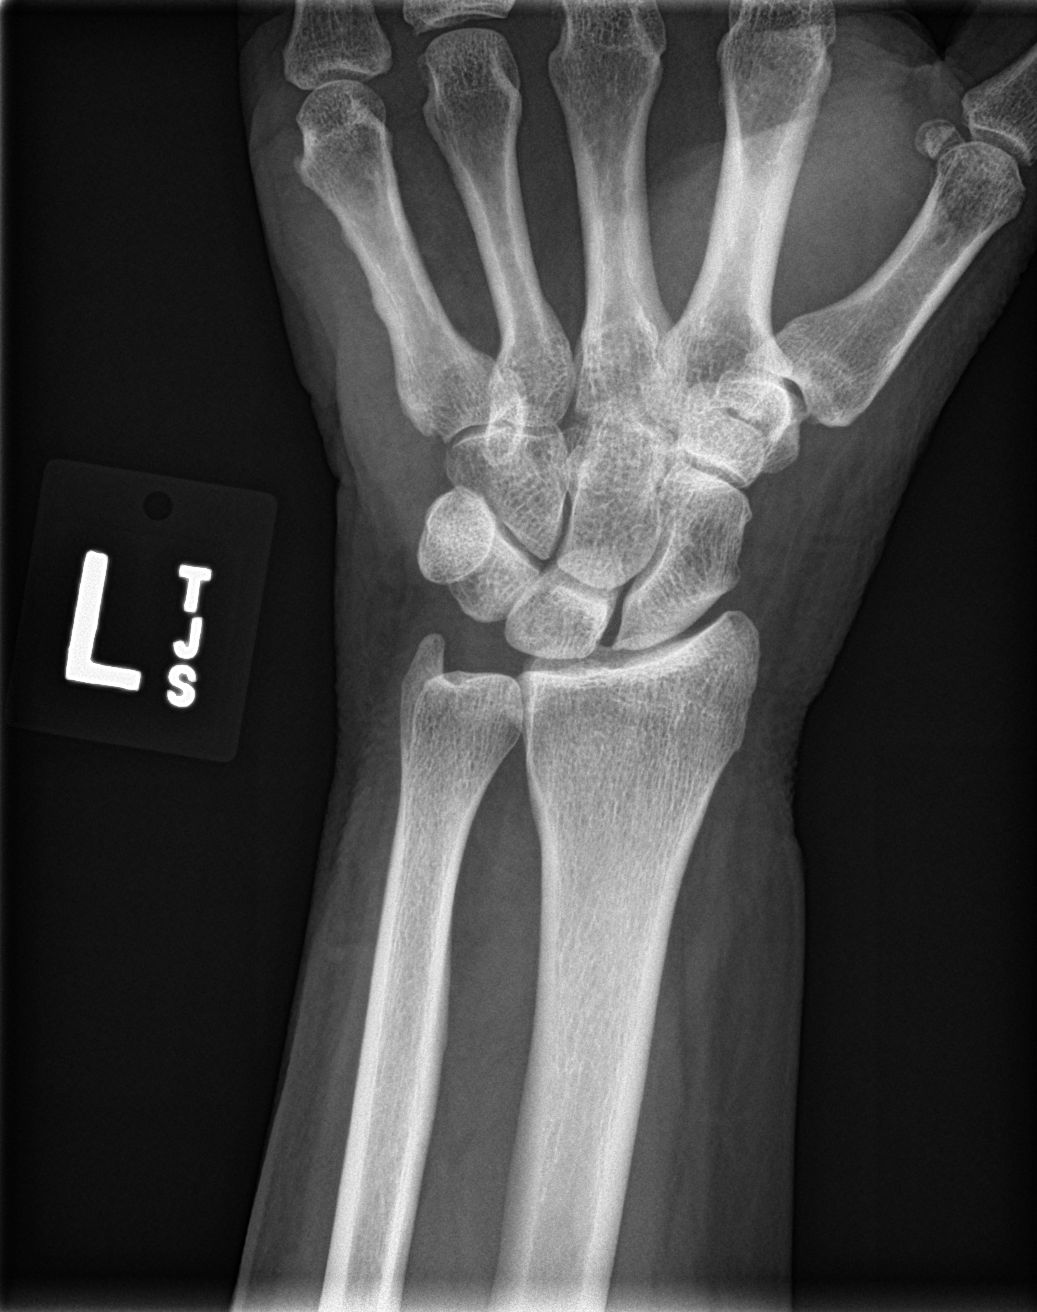
[im 3/4]
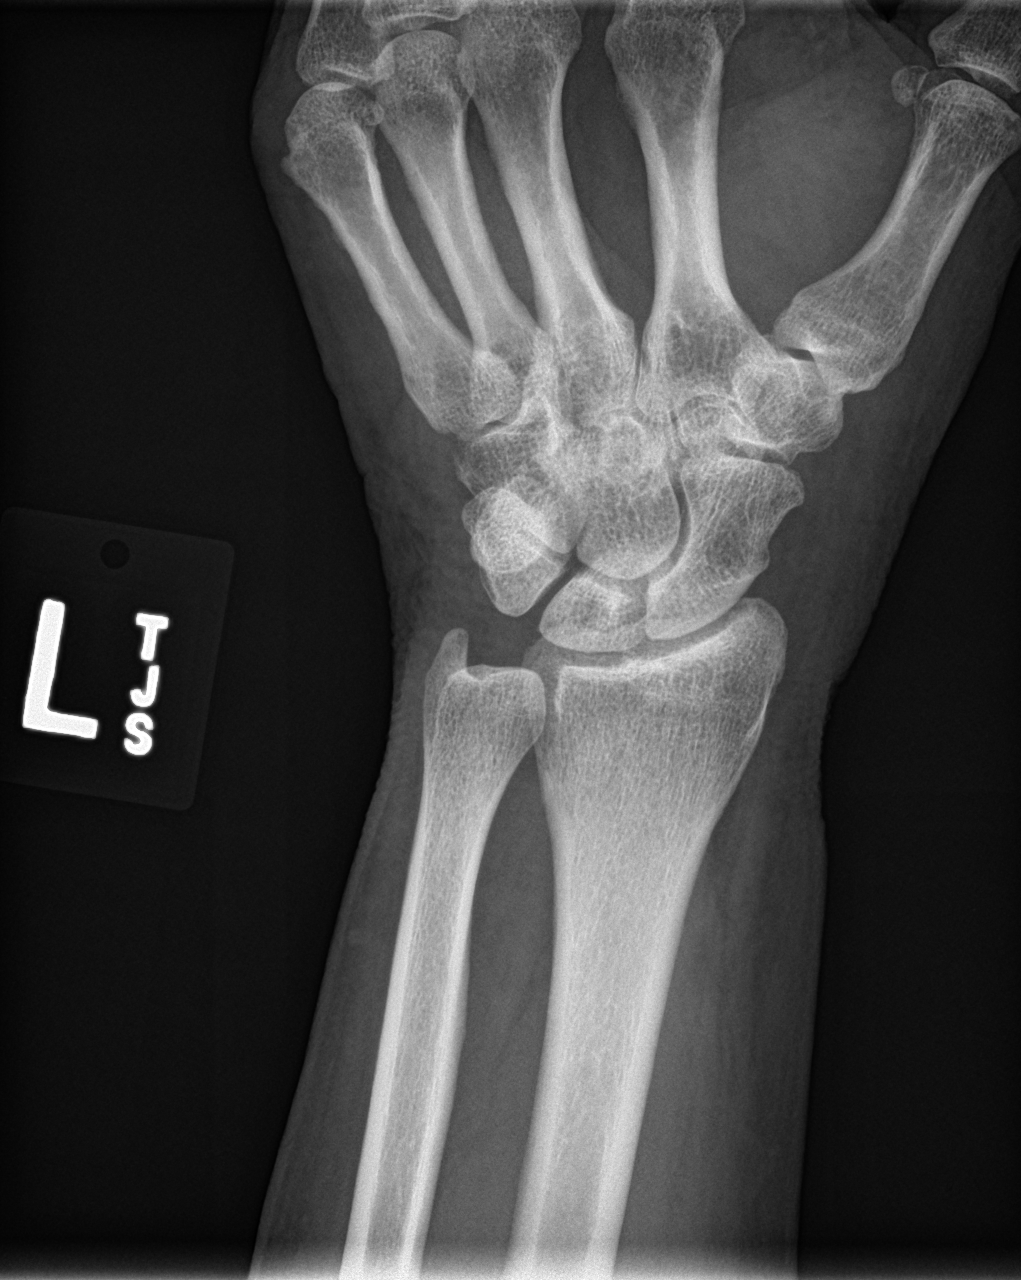
[im 4/4]
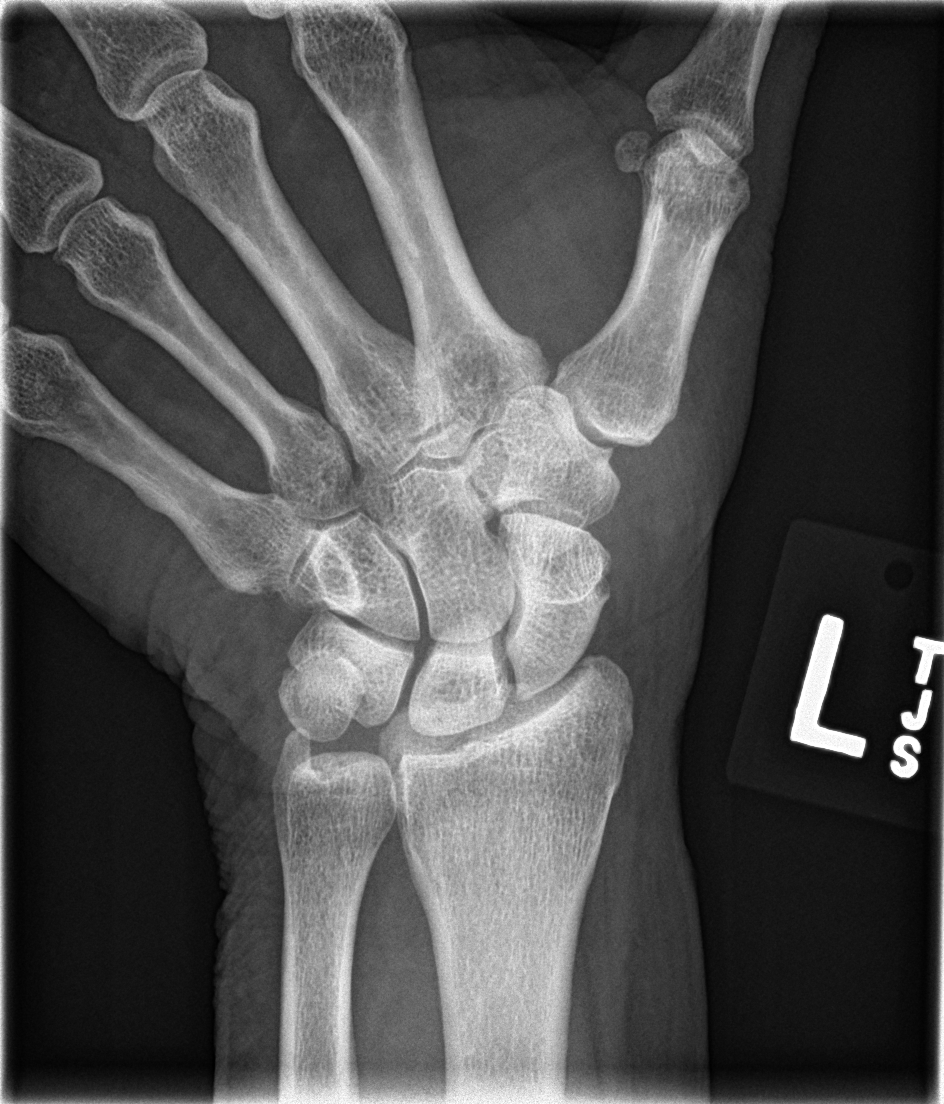

[4 of 4 positions shown; findings below may reference images not displayed]

FINDINGS: Osseous mineralization normal.

Joint spaces preserved.

No acute fracture, dislocation or bone destruction.

No erosive or inflammatory changes.
IMPRESSION: No acute abnormalities.

## 2020-01-05 IMAGING — CR RIGHT WRIST - COMPLETE 3+ VIEW
1 series · 4 of 4 positions shown · non-contrast
Comparison: None

CLINICAL DATA: Progressively worsening BILATERAL hand and wrist
pain for 2 months, pain and swelling in all fingers, remote RIGHT
thumb injury 40 years ago

EXAM:
RIGHT WRIST - COMPLETE 3+ VIEW

[Series 1: dg wrist complete right · 0.14mm/px · 4 of 4 slices shown]
[im 1/4]
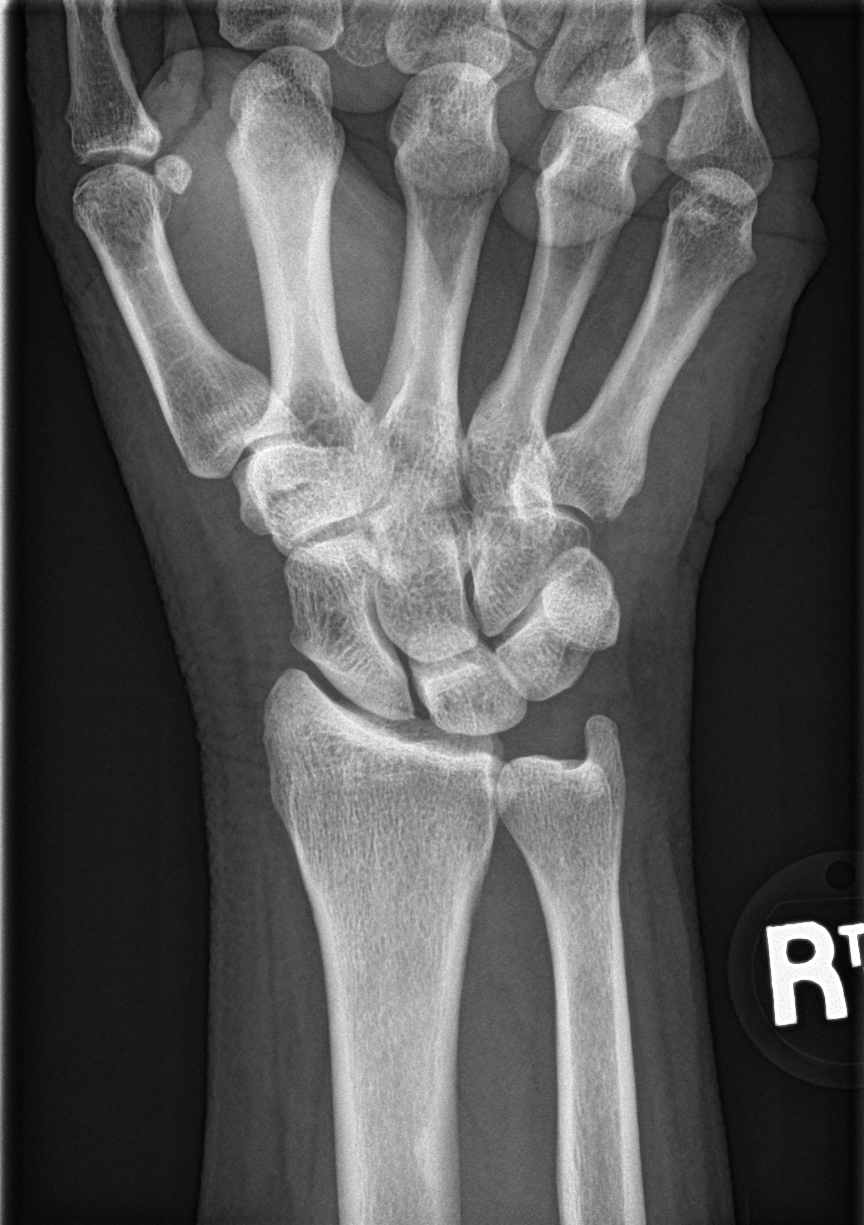
[im 2/4]
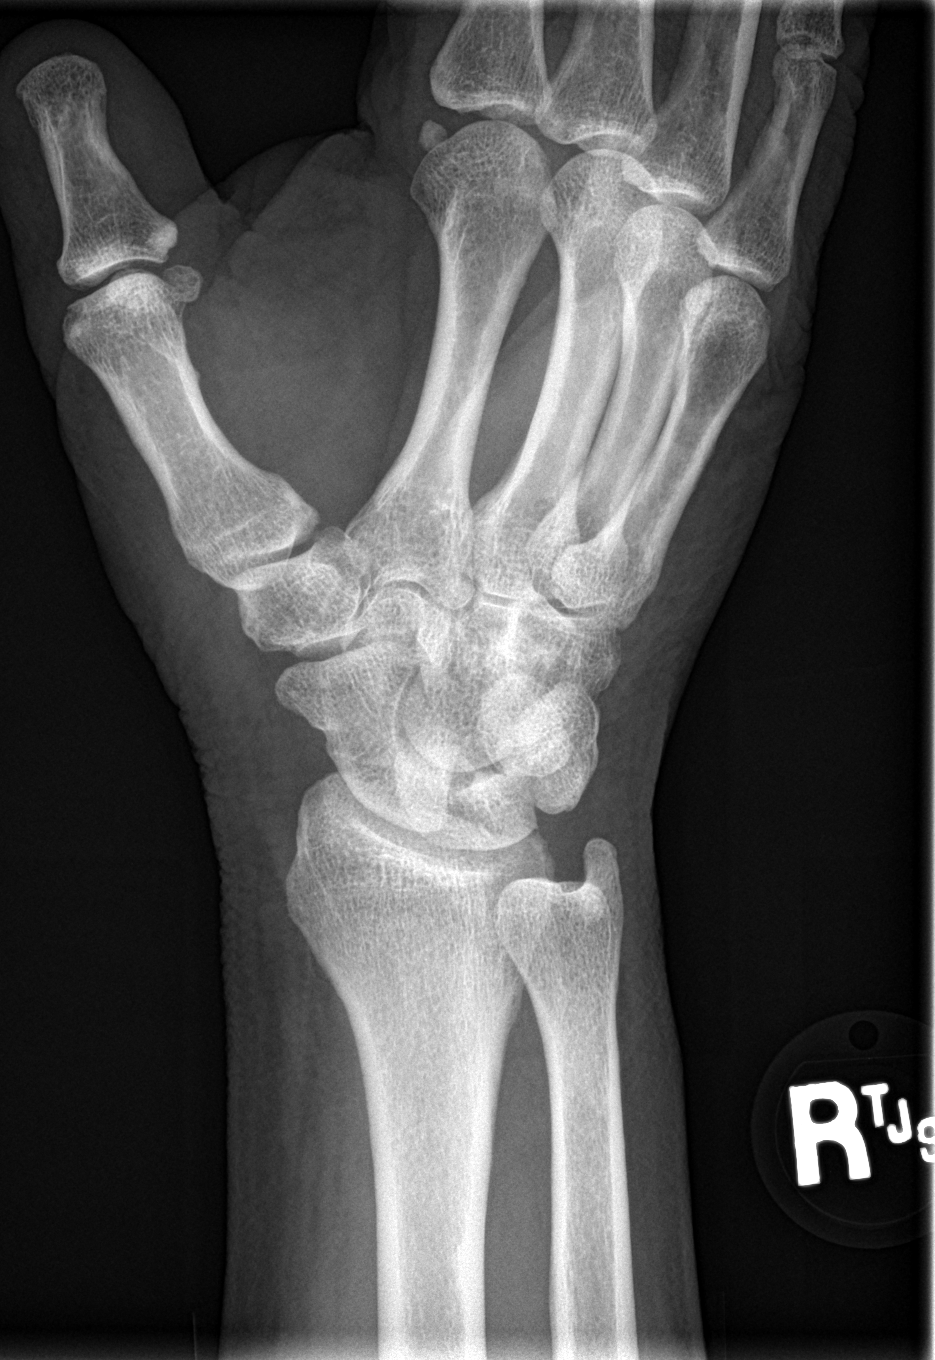
[im 3/4]
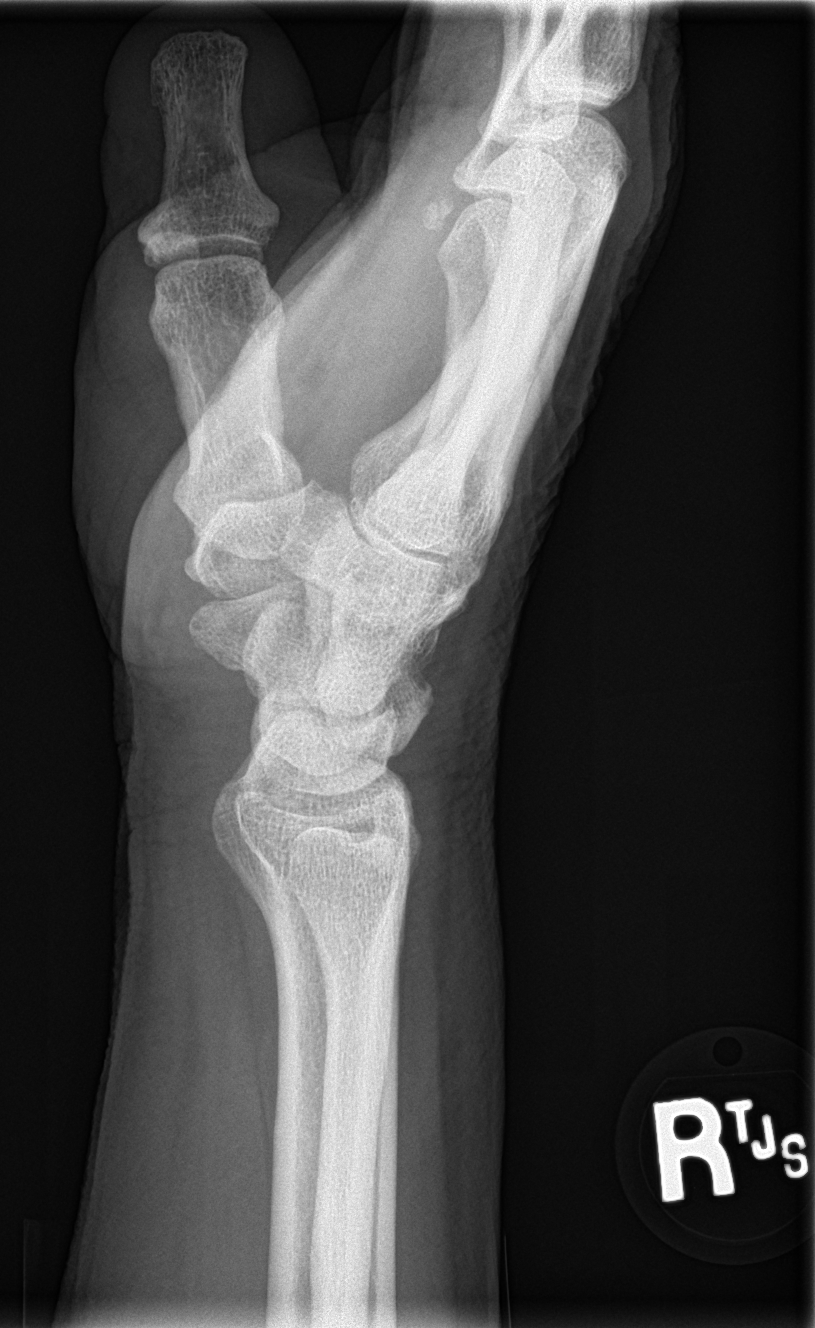
[im 4/4]
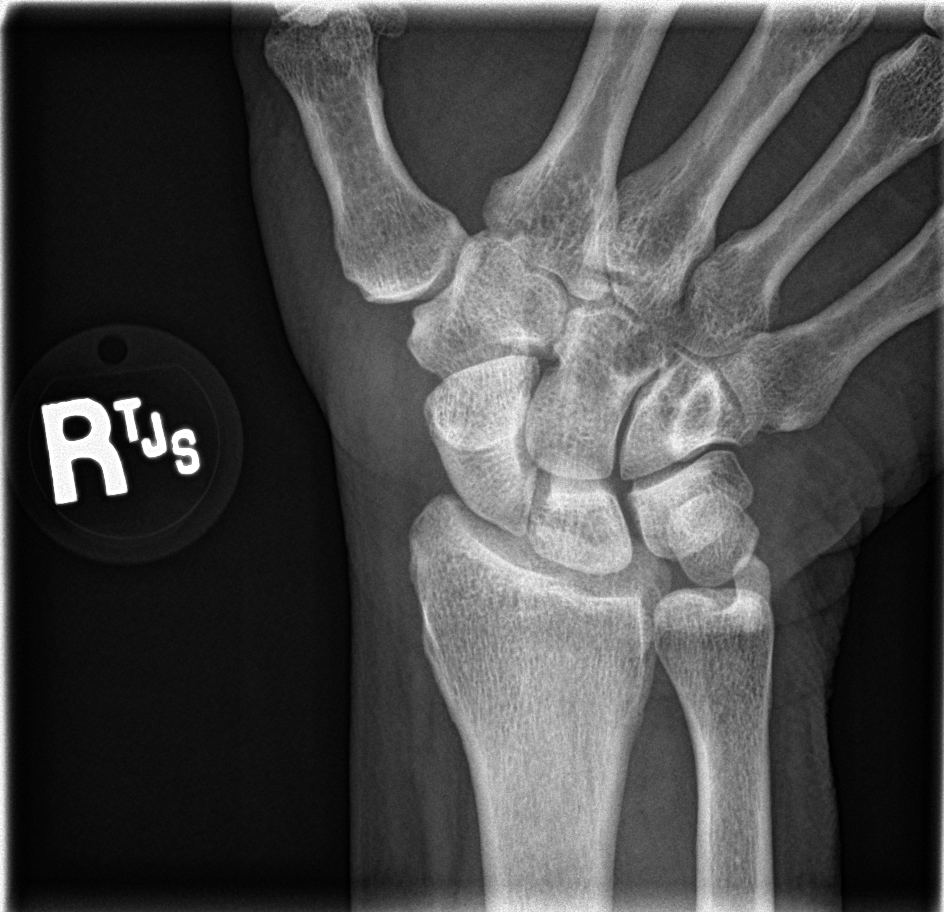

[4 of 4 positions shown; findings below may reference images not displayed]

FINDINGS: Osseous mineralization normal.

Joint spaces preserved.

No acute fracture or dislocation.

Osseous lucency identified at the distal capitate, appears fairly
well defined, favor subchondral cyst likely degenerative.
IMPRESSION: Suspected subchondral cyst/degenerative change at the distal pole of
the capitate, less likely potentially reflecting an inflammatory
arthropathy.

## 2020-04-17 ENCOUNTER — Other Ambulatory Visit: Payer: Self-pay

## 2020-04-17 ENCOUNTER — Inpatient Hospital Stay: Payer: Federal, State, Local not specified - PPO | Attending: Radiation Oncology

## 2020-04-17 DIAGNOSIS — R972 Elevated prostate specific antigen [PSA]: Secondary | ICD-10-CM | POA: Diagnosis not present

## 2020-04-17 DIAGNOSIS — Z923 Personal history of irradiation: Secondary | ICD-10-CM | POA: Insufficient documentation

## 2020-04-17 DIAGNOSIS — C61 Malignant neoplasm of prostate: Secondary | ICD-10-CM | POA: Diagnosis present

## 2020-04-17 LAB — PSA: Prostatic Specific Antigen: 0.29 ng/mL (ref 0.00–4.00)

## 2020-04-21 ENCOUNTER — Inpatient Hospital Stay: Payer: Federal, State, Local not specified - PPO

## 2020-04-25 ENCOUNTER — Encounter: Payer: Self-pay | Admitting: Radiation Oncology

## 2020-04-28 ENCOUNTER — Ambulatory Visit
Admission: RE | Admit: 2020-04-28 | Discharge: 2020-04-28 | Disposition: A | Discharge status: Home / Self Care | Payer: Federal, State, Local not specified - PPO | Source: Ambulatory Visit | Attending: Radiation Oncology | Admitting: Radiation Oncology

## 2020-04-28 ENCOUNTER — Encounter: Payer: Self-pay | Admitting: Radiation Oncology

## 2020-04-28 ENCOUNTER — Other Ambulatory Visit: Payer: Self-pay

## 2020-04-28 VITALS — BP 139/68 | HR 53 | Resp 16 | Wt 223.6 lb

## 2020-04-28 DIAGNOSIS — Z923 Personal history of irradiation: Secondary | ICD-10-CM | POA: Insufficient documentation

## 2020-04-28 DIAGNOSIS — C61 Malignant neoplasm of prostate: Secondary | ICD-10-CM | POA: Insufficient documentation

## 2020-04-28 NOTE — Progress Notes (Signed)
Radiation Oncology Follow up Note  Name: Manuel Patel   Date:   04/28/2020 MRN:  [id number] DOB: [birth year]    This 69 y.o. male presents to the clinic today for 77-month follow-up status post I-125 interstitial implant for Gleason 6 adenocarcinoma the prostate presenting with a PSA of 6.Marland Kitchen  REFERRING PROVIDER: Albina Billet, MD  HPI: Patient is a 69 year old male now out 10 months having completed I-125 interstitial implant for Gleason 6 adenocarcinoma the prostate presenting with a PSA of 6 seen today in routine follow-up he is doing well.  He specifically denies any increased lower urinary tract symptoms diarrhea or fatigue his most recent PSA is stable at 0.29 actually decreased since his last test 6 months prior..  COMPLICATIONS OF TREATMENT: none  FOLLOW UP COMPLIANCE: keeps appointments   PHYSICAL EXAM:  BP 139/68 (BP Location: Left Arm, Patient Position: Sitting)   Pulse (!) 53   Resp 16   Wt 223 lb 9.6 oz (101.4 kg)   BMI 32.08 kg/m  Well-developed well-nourished patient in NAD. HEENT reveals PERLA, EOMI, discs not visualized.  Oral cavity is clear. No oral mucosal lesions are identified. Neck is clear without evidence of cervical or supraclavicular adenopathy. Lungs are clear to A&P. Cardiac examination is essentially unremarkable with regular rate and rhythm without murmur rub or thrill. Abdomen is benign with no organomegaly or masses noted. Motor sensory and DTR levels are equal and symmetric in the upper and lower extremities. Cranial nerves II through XII are grossly intact. Proprioception is intact. No peripheral adenopathy or edema is identified. No motor or sensory levels are noted. Crude visual fields are within normal range.  RADIOLOGY RESULTS: No current films to review  PLAN: Present time patient is under excellent biochemical control of his prostate cancer and pleased with his overall progress.  I have asked to see him back in 6 months for follow-up and then will  start once year follow-up visits.  Patient knows to call with any concerns.  I would like to take this opportunity to thank you for allowing me to participate in the care of your patient.Noreene Filbert, MD

## 2020-07-02 ENCOUNTER — Ambulatory Visit: Payer: Federal, State, Local not specified - PPO | Admitting: Dermatology

## 2020-07-02 ENCOUNTER — Other Ambulatory Visit: Payer: Self-pay

## 2020-07-02 DIAGNOSIS — L300 Nummular dermatitis: Secondary | ICD-10-CM | POA: Diagnosis not present

## 2020-07-02 DIAGNOSIS — B351 Tinea unguium: Secondary | ICD-10-CM | POA: Diagnosis not present

## 2020-07-02 MED ORDER — TRIAMCINOLONE ACETONIDE 0.1 % EX OINT: 1.0000 "application " | TOPICAL_OINTMENT | Freq: Every day | CUTANEOUS | 0 refills | Status: DC | PRN

## 2020-07-02 NOTE — Patient Instructions (Addendum)
Gentle Skin Care Guide  1. Bathe no more than once a day.  2. Avoid bathing in hot water  3. Use a mild soap like Dove, Vanicream, Cetaphil, CeraVe. Can use Lever 2000 or Cetaphil antibacterial soap  4. Use soap only where you need it. On most days, use it under your arms, between your legs, and on your feet. Let the water rinse other areas unless visibly dirty.  5. When you get out of the bath/shower, use a towel to gently blot your skin dry, don't rub it.  6. While your skin is still a little damp, apply a moisturizing cream such as Vanicream, CeraVe, Cetaphil, Eucerin, Sarna lotion or plain Vaseline Jelly. For hands apply Neutrogena [place] Hand Cream or Excipial Hand Cream.  7. Reapply moisturizer any time you start to itch or feel dry.  8. Sometimes using free and clear laundry detergents can be helpful. Fabric softener sheets should be avoided. Downy Free & Gentle liquid, or any liquid fabric softener that is free of dyes and perfumes, it acceptable to use  9. If your doctor has given you prescription creams you may apply moisturizers over them    Recommend Gold Bond Rapid Relief Anti-Itch cream up to 3 times per day to areas that are itchy.

## 2020-07-02 NOTE — Progress Notes (Signed)
   New Patient Visit  Subjective  Manuel Patel is a 69 y.o. male who presents for the following: Rash (C/O rash at bilateral ankles and feet. Itching, irritated.  Dur: several weeks. Worsened last week. Using OTC cream, no help.).  Objective  Well appearing patient in no apparent distress; mood and affect are within normal limits.  Review of Systems: No other skin or systemic complaints except as noted in HPI or Assessment and Plan.  A focused examination was performed including face, arms, ankles, feet. Relevant physical exam findings are noted in the Assessment and Plan.  Objective  Right Hallux Toe Nail Plate: Yellow discoloration, thickening, debris  Objective  Bilateral feet and ankles: Scaly erythematous papules and plaques  Assessment & Plan  Tinea unguium Right Hallux Toe Nail Plate  Observe. Patient defers treatment today.  Nummular eczema Bilateral feet and ankles  KOH negative  Start Triamcinolone 0.1% ointment twice a day as needed. Avoid applying to face, groin, and axilla. Use as directed. Risk of skin atrophy with long-term use reviewed.   If not controlled or recurrent consider patch testing.   Recommend Gold Bond Rapid Relief Anti-Itch cream up to 3 times per day to areas that are itchy.   Reviewed gentle skin care.  Topical steroids (such as triamcinolone, fluocinolone, fluocinonide, mometasone, clobetasol, halobetasol, betamethasone, hydrocortisone) can cause thinning and lightening of the skin if they are used for too long in the same area. Your physician has selected the right strength medicine for your problem and area affected on the body. Please use your medication only as directed by your physician to prevent side effects.    triamcinolone ointment (KENALOG) 0.1 % - Bilateral feet and ankles  Return in about 6 weeks (around 08/13/2020) for rash recheck.   I, Emelia Salisbury, CMA, am acting as scribe for Forest Gleason, MD.  Documentation: I have  reviewed the above documentation for accuracy and completeness, and I agree with the above.  Forest Gleason, MD

## 2020-07-07 ENCOUNTER — Encounter: Payer: Self-pay | Admitting: Dermatology

## 2020-08-21 ENCOUNTER — Ambulatory Visit: Payer: Federal, State, Local not specified - PPO | Admitting: Dermatology

## 2020-09-05 ENCOUNTER — Other Ambulatory Visit: Payer: Federal, State, Local not specified - PPO

## 2020-09-06 ENCOUNTER — Other Ambulatory Visit: Payer: Federal, State, Local not specified - PPO

## 2020-10-22 ENCOUNTER — Inpatient Hospital Stay: Payer: Federal, State, Local not specified - PPO | Attending: Radiation Oncology

## 2020-10-22 DIAGNOSIS — C61 Malignant neoplasm of prostate: Secondary | ICD-10-CM | POA: Insufficient documentation

## 2020-10-22 LAB — PSA: Prostatic Specific Antigen: 0.16 ng/mL (ref 0.00–4.00)

## 2020-10-29 ENCOUNTER — Encounter: Payer: Self-pay | Admitting: Radiation Oncology

## 2020-10-29 ENCOUNTER — Ambulatory Visit
Admission: RE | Admit: 2020-10-29 | Discharge: 2020-10-29 | Disposition: A | Discharge status: Home / Self Care | Payer: Federal, State, Local not specified - PPO | Source: Ambulatory Visit | Attending: Radiation Oncology | Admitting: Radiation Oncology

## 2020-10-29 VITALS — BP 133/62 | HR 54 | Temp 97.1°F | Wt 223.0 lb

## 2020-10-29 DIAGNOSIS — C61 Malignant neoplasm of prostate: Secondary | ICD-10-CM

## 2020-10-29 NOTE — Progress Notes (Signed)
Radiation Oncology Follow up Note  Name: Manuel Patel   Date:   10/29/2020 MRN:  [id number] DOB: Dec 04, 1950    This 70 y.o. male presents to the clinic today for 78-month follow-up status post I-125 interstitial implant for Gleason 6 adenocarcinoma the prostate presenting with a PSA of 6.  REFERRING PROVIDER: Albina Billet, MD  HPI: Patient is a 70 year old male now out 16 months having completed I-125 interstitial implant for Gleason 6 adenocarcinoma the prostate seen today in routine follow-up he is doing well still is on Flomax.  He has nocturia 1-2 times.  He is otherwise without complaints specifically denies diarrhea or fatigue.  His PSA remains low at 0.16.Marland Kitchen  COMPLICATIONS OF TREATMENT: none  FOLLOW UP COMPLIANCE: keeps appointments   PHYSICAL EXAM:  BP 133/62   Pulse (!) 54   Temp (!) 97.1 F (36.2 C) (Tympanic)   Wt 223 lb (101.2 kg)   BMI 32.00 kg/m  Well-developed well-nourished patient in NAD. HEENT reveals PERLA, EOMI, discs not visualized.  Oral cavity is clear. No oral mucosal lesions are identified. Neck is clear without evidence of cervical or supraclavicular adenopathy. Lungs are clear to A&P. Cardiac examination is essentially unremarkable with regular rate and rhythm without murmur rub or thrill. Abdomen is benign with no organomegaly or masses noted. Motor sensory and DTR levels are equal and symmetric in the upper and lower extremities. Cranial nerves II through XII are grossly intact. Proprioception is intact. No peripheral adenopathy or edema is identified. No motor or sensory levels are noted. Crude visual fields are within normal range.  RADIOLOGY RESULTS: No current films for review  PLAN: Present time patient is doing well with no evidence of disease excellent biochemical control of his prostate cancer now out 16 months.  Of asked to see him back in 1 year for follow-up.  Patient knows to call with any concerns.  I would like to take this opportunity to  thank you for allowing me to participate in the care of your patient.Noreene Filbert, MD

## 2020-11-12 ENCOUNTER — Telehealth: Payer: Self-pay | Admitting: Urology

## 2020-11-12 NOTE — Telephone Encounter (Signed)
Please call Manuel Patel and get him scheduled for a 6 month follow up in September for a PSA and recheck.  It is recommended to get a PSA every 6 months for surveillance.

## 2020-11-14 NOTE — Telephone Encounter (Signed)
Just so I'm clear, you want him to follow-up with [place] for a office visit? 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

## 2020-11-14 NOTE — Telephone Encounter (Signed)
Guidelines from the [place] recommend that the PSA be checked every 6 months for 5 years.  We typically have patient's see [place] at 6 months and then Dr. Donella Stade every year.  I tried to contact the patient to explain this to him

## 2020-11-14 NOTE — Telephone Encounter (Signed)
Spoke with patient and Dr. Donella Stade at the cancer center is following his PSA last checked 10/22/2020. Pt states he feels good and would like to continue to get his Flomax filled either thru [place] or Dr. Donella Stade.

## 2020-11-16 NOTE — Telephone Encounter (Signed)
Dr. Donella Stade does not have him coming back until March of 2023 for blood work and an office visit according to [employer].

## 2020-11-18 NOTE — Telephone Encounter (Signed)
appt scheduled 12/11/2020

## 2020-12-10 NOTE — Progress Notes (Signed)
12/11/2020 8:24 AM   [place] 12/30/1950 [id number]  Referring provider: Albina Billet, MD [address]   [place],  [place] [address]  Chief Complaint  Patient presents with  . Follow-up   Urological history: 1. 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 LU TS -I PSS 9/4 -PVR 94 mL  -managed with tamsulosin 0.4 mg daily   HPI: Manuel Patel is a 70 y.o. male who presents today for 6 months follow up.  He is experiencing urinary frequency.  He is taking tamsulosin 0.4 mg daily.  Patient denies any modifying or aggravating factors.  Patient denies any gross hematuria, dysuria or suprapubic/flank pain.  Patient denies any fevers, chills, nausea or vomiting.    IPSS    Row Name 12/11/20 1600         International Prostate Symptom Score   How often have you had the sensation of not emptying your bladder? Less than half the time     How often have you had to urinate less than every two hours? Not at All     How often have you found you stopped and started again several times when you urinated? Less than 1 in 5 times     How often have you found it difficult to postpone urination? Less than half the time     How often have you had a weak urinary stream? Not at All     How often have you had to strain to start urination? Less than 1 in 5 times     How many times did you typically get up at night to urinate? 3 Times     Total IPSS Score 9           Quality of Life due to urinary symptoms   If you were to spend the rest of your life with your urinary  condition just the way it is now how would you feel about that? Mostly Disatisfied            Score:  1-7 Mild 8-19 Moderate 20-35 Severe     PMH: Past Medical History:  Diagnosis Date  . Blind left eye   . Bronchitis   . Cancer [facility]) 2019   prostate  . Glaucoma, right eye   . History of bronchitis   . History of kidney stones   . Hypertension   . Numbness and tingling in left arm     Surgical History: Past Surgical History:  Procedure Laterality Date  . ANKLE SURGERY Right   . ANKLE SURGERY    . ANTERIOR CERVICAL DECOMP/DISCECTOMY FUSION N/A 03/17/2016   Procedure: ANTERIOR CERVICAL DECOMPRESSION FUSION CERVICAL 3-4, CERVICAL 4-5, CERVICAL 5-6 WITH INSTRUMENTATION AND ALLOGRAFT;  Surgeon: Phylliss Bob, MD;  Location: [facility];  Service: Orthopedics;  Laterality: N/A;  ANTERIOR CERVICAL DECOMPRESSION FUSION CERVICAL 3-4, CERVICAL 4-5, CERVICAL 5-6 WITH INSTRUMENTATION AND ALLOGRAFT  . COLONOSCOPY    . COLONOSCOPY WITH PROPOFOL N/A 03/29/2018   Procedure: COLONOSCOPY WITH PROPOFOL;  Surgeon: Robert Bellow, MD;  Location: [facility];  Service: Endoscopy;  Laterality: N/A;  . CYSTOSCOPY N/A 06/19/2019  Procedure: CYSTOSCOPY;  Surgeon: Abbie Sons, MD;  Location: [facility];  Service: Urology;  Laterality: N/A;  . EYE SURGERY Left    prosthetic left eye  . EYE SURGERY Right    cataract  . RADIOACTIVE SEED IMPLANT N/A 06/19/2019   Procedure: RADIOACTIVE SEED IMPLANT/BRACHYTHERAPY IMPLANT;  Surgeon: Abbie Sons, MD;  Location: [facility];  Service: Urology;  Laterality: N/A;    Home Medications:  Allergies as of 12/11/2020      Reactions   Penicillin V Rash   Did it involve swelling of the face/tongue/throat, SOB, or low BP? No Did it involve sudden or severe rash/hives, skin peeling, or any reaction on the inside of your mouth or nose? No Did you need to seek medical attention at a hospital or doctor's office? Unknown When did it last happen?childhood  allergy  If all above answers are "NO", may proceed with cephalosporin use.   Pineapple Nausea Only      Medication List       Accurate as of Dec 11, 2020 11:59 PM. If you have any questions, ask your nurse or doctor.        STOP taking these medications   cefdinir 300 MG capsule Commonly known as: OMNICEF Stopped by: [name], PA-C     TAKE these medications   amLODipine 10 MG tablet Commonly known as: NORVASC Take 10 mg by mouth at bedtime.   atenolol 50 MG tablet Commonly known as: TENORMIN Take 50 mg by mouth 2 (two) times daily.   fluticasone 50 MCG/ACT nasal spray Commonly known as: FLONASE Place 1 spray into both nostrils daily as needed for allergies or rhinitis.   ibuprofen 200 MG tablet Commonly known as: ADVIL Take 400 mg by mouth daily as needed for moderate pain.   multivitamin tablet Take 1 tablet by mouth 3 (three) times a week.   tamsulosin 0.4 MG Caps capsule Commonly known as: FLOMAX Take 1 capsule (0.4 mg total) by mouth daily.   timolol 0.5 % ophthalmic gel-forming Commonly known as: TIMOPTIC-XR Place 1 drop into the right eye at bedtime.   triamcinolone ointment 0.1 % Commonly known as: KENALOG Apply 1 application topically daily as needed.       Allergies:  Allergies  Allergen Reactions  . Penicillin V Rash    Did it involve swelling of the face/tongue/throat, SOB, or low BP? No Did it involve sudden or severe rash/hives, skin peeling, or any reaction on the inside of your mouth or nose? No Did you need to seek medical attention at a hospital or doctor's office? Unknown When did it last happen?childhood allergy  If all above answers are "NO", may proceed with cephalosporin use.   Marland Kitchen Pineapple Nausea Only    Family History: No family history on file.  Social History:  reports that he has never smoked. He has never used smokeless tobacco. He reports that he does not drink alcohol and does not use drugs.  ROS For  pertinent review of systems please refer to history of present illness  Physical Exam: BP 136/73   Pulse (!) 48   Ht 5\' 10"  (1.778 m)   Wt 223 lb (101.2 kg)   BMI 32.00 kg/m   Constitutional:  Well nourished. Alert and oriented, No acute distress. HEENT: [place] AT, mask in place.  Trachea midline Cardiovascular: No clubbing, cyanosis, or edema. Respiratory: Normal respiratory effort, no increased work of breathing. Neurologic: Grossly intact, no focal deficits, moving all 4 extremities. Psychiatric: Normal  mood and affect.  Laboratory Data: Recent Results (from the past 2160 hour(s))  PSA     Status: None   Collection Time: 10/22/20  4:10 PM  Result Value Ref Range   Prostatic Specific Antigen 0.16 0.00 - 4.00 ng/mL    Comment: (NOTE) While PSA levels of <=4.0 ng/ml are reported as reference range, some men with levels below 4.0 ng/ml can have prostate cancer and many men with PSA above 4.0 ng/ml do not have prostate cancer.  Other tests such as free PSA, age specific reference ranges, PSA velocity and PSA doubling time may be helpful especially in men less than 32 years old. Performed at [facility], [place] [address], [place], [place] [address]   Bladder Scan (Post Void Residual) in office     Status: None   Collection Time: 12/11/20  4:01 PM  Result Value Ref Range   Scan Result 94   I have reviewed the labs.   Pertinent Imaging: Results for Manuel, Patel" (MRN [id number]) as of 12/26/2020 08:20  Ref. Range 12/11/2020 16:01  Scan Result Unknown 94    Assessment & Plan:    1. Prostate cancer -good biochemical control of prostate cancer -discussed NCCN guidelines regarding prostate cancer follow up which recommends PSA q 6 months for 5 years after diagnosis -PSA in 6 months   2. LU TS -continue tamsulosin 0.4 mg daily    Return in about 6 months (around 06/13/2021) for IPSS, PSA, PVR and exam.  These notes generated with voice recognition software. I  apologize for typographical errors.  [name], PA-C  [facility] [address]  [place], [place] [address] [phone]

## 2020-12-11 ENCOUNTER — Other Ambulatory Visit: Payer: Self-pay

## 2020-12-11 ENCOUNTER — Ambulatory Visit: Payer: Federal, State, Local not specified - PPO | Admitting: Urology

## 2020-12-11 ENCOUNTER — Encounter: Payer: Self-pay | Admitting: Urology

## 2020-12-11 VITALS — BP 136/73 | HR 48 | Ht 70.0 in | Wt 223.0 lb

## 2020-12-11 DIAGNOSIS — N453 Epididymo-orchitis: Secondary | ICD-10-CM

## 2020-12-11 DIAGNOSIS — C61 Malignant neoplasm of prostate: Secondary | ICD-10-CM

## 2020-12-11 DIAGNOSIS — R350 Frequency of micturition: Secondary | ICD-10-CM

## 2020-12-11 LAB — BLADDER SCAN AMB NON-IMAGING: Scan Result: 94

## 2020-12-20 ENCOUNTER — Other Ambulatory Visit: Payer: Self-pay | Admitting: Urology

## 2020-12-20 DIAGNOSIS — C61 Malignant neoplasm of prostate: Secondary | ICD-10-CM

## 2021-02-19 ENCOUNTER — Ambulatory Visit: Payer: Federal, State, Local not specified - PPO | Admitting: Dermatology

## 2021-02-23 ENCOUNTER — Ambulatory Visit: Payer: Federal, State, Local not specified - PPO | Admitting: Dermatology

## 2021-04-27 ENCOUNTER — Other Ambulatory Visit: Payer: Self-pay | Admitting: Urology

## 2021-04-27 DIAGNOSIS — C61 Malignant neoplasm of prostate: Secondary | ICD-10-CM

## 2021-06-16 ENCOUNTER — Ambulatory Visit: Payer: Federal, State, Local not specified - PPO | Admitting: Urology

## 2021-06-22 NOTE — Progress Notes (Signed)
06/23/2021 4:19 PM   [place] 08-02-1951 [id number]  Referring provider: Albina Billet, MD [address]   [place],  [place] [address]  Chief Complaint  Patient presents with   Prostate Cancer    Urological history: 1. Prostate cancer -PSA pending  -cT1c low risk adenocarcinoma prostate -Biopsy December 2018 PSA 4.2; volume 31 cc -2/12 cores Gleason 3+3 adenocarcinoma left mid/left apex(10 and 30% of submitted tissue) -Elected active surveillance -PSA 01/2018 4.6 -MRI 01/2018; PI-RADS 4 lesion left anterior apex -Fusion biopsy 03/2018; ROI 2 cores positive Gleason 3+3 adenocarcinoma 10% each otherwise negative -s/p I-125 interstitial implant on 06/19/2019  2. LU TS -I PSS 10/2 -PVR 87 mL  -managed with tamsulosin 0.4 mg daily   HPI: Manuel Patel is a 70 y.o. male who presents today for 6 months follow up.  He has no urinary complaints at this time.  He has baseline nocturia of 2 to 3 times.  Patient denies any modifying or aggravating factors.  Patient denies any gross hematuria, dysuria or suprapubic/flank pain.  Patient denies any fevers, chills, nausea or vomiting.      IPSS     Row Name 06/23/21 1500         International Prostate Symptom Score   How often have you had the sensation of not emptying your bladder? Less than 1 in 5     How often have you had to urinate less than every two hours? About half the time     How often have you found you stopped and started again several times when you urinated? Less than 1 in 5 times     How often have you found it difficult to postpone urination? Less than 1 in 5 times     How often have you had a weak urinary stream? Less than 1 in 5 times     How often have you had to strain to start urination? Not at All     How many times did you typically get up at night to urinate? 3 Times     Total IPSS Score 10       Quality of Life due to urinary symptoms   If you were to spend the rest of your life with your  urinary condition just the way it is now how would you feel about that? Mostly Satisfied               Score:  1-7 Mild 8-19 Moderate 20-35 Severe     PMH: Past Medical History:  Diagnosis Date   Blind left eye    Bronchitis    Cancer ([place]) 2019   prostate   Glaucoma, right eye    History of bronchitis    History of kidney stones    Hypertension    Numbness and tingling in left arm     Surgical History: Past Surgical History:  Procedure Laterality Date   ANKLE SURGERY Right    ANKLE SURGERY     ANTERIOR CERVICAL DECOMP/DISCECTOMY FUSION N/A 03/17/2016   Procedure: ANTERIOR CERVICAL DECOMPRESSION FUSION CERVICAL 3-4, CERVICAL 4-5, CERVICAL 5-6 WITH INSTRUMENTATION AND ALLOGRAFT;  Surgeon: Phylliss Bob, MD;  Location: [facility];  Service: Orthopedics;  Laterality: N/A;  ANTERIOR CERVICAL DECOMPRESSION FUSION CERVICAL 3-4, CERVICAL 4-5, CERVICAL 5-6 WITH INSTRUMENTATION AND ALLOGRAFT   COLONOSCOPY     COLONOSCOPY WITH PROPOFOL N/A 03/29/2018   Procedure: COLONOSCOPY WITH PROPOFOL;  Surgeon: Robert Bellow, MD;  Location: [facility];  Service: Endoscopy;  Laterality: N/A;   CYSTOSCOPY N/A 06/19/2019   Procedure: CYSTOSCOPY;  Surgeon: Abbie Sons, MD;  Location: [facility];  Service: Urology;  Laterality: N/A;   EYE SURGERY Left    prosthetic left eye   EYE SURGERY Right    cataract   RADIOACTIVE SEED IMPLANT N/A 06/19/2019   Procedure: RADIOACTIVE SEED IMPLANT/BRACHYTHERAPY IMPLANT;  Surgeon: Abbie Sons, MD;  Location: [facility];  Service: Urology;  Laterality: N/A;    Home Medications:  Allergies as of 06/23/2021       Reactions   Penicillin V Rash   Did it involve swelling of the face/tongue/throat, SOB, or low BP? No Did it involve sudden or severe rash/hives, skin peeling, or any reaction on the inside of your mouth or nose? No Did you need to seek medical attention at a hospital or doctor's office? Unknown When did it last happen?      childhood  allergy  If all above answers are "NO", may proceed with cephalosporin use.   Pineapple Nausea Only        Medication List        Accurate as of June 23, 2021  4:19 PM. If you have any questions, ask your nurse or doctor.          amLODipine 10 MG tablet Commonly known as: NORVASC Take 10 mg by mouth at bedtime.   atenolol 50 MG tablet Commonly known as: TENORMIN Take 50 mg by mouth 2 (two) times daily.   fluticasone 50 MCG/ACT nasal spray Commonly known as: FLONASE Place 1 spray into both nostrils daily as needed for allergies or rhinitis.   ibuprofen 200 MG tablet Commonly known as: ADVIL Take 400 mg by mouth daily as needed for moderate pain.   multivitamin tablet Take 1 tablet by mouth 3 (three) times a week.   tamsulosin 0.4 MG Caps capsule Commonly known as: FLOMAX TAKE 1 CAPSULE BY MOUTH ONCE DAILY   timolol 0.5 % ophthalmic gel-forming Commonly known as: TIMOPTIC-XR Place 1 drop into the right eye at bedtime.   triamcinolone ointment 0.1 % Commonly known as: KENALOG Apply 1 application topically daily as needed.        Allergies:  Allergies  Allergen Reactions   Penicillin V Rash    Did it involve swelling of the face/tongue/throat, SOB, or low BP? No Did it involve sudden or severe rash/hives, skin peeling, or any reaction on the inside of your mouth or nose? No Did you need to seek medical attention at a hospital or doctor's office? Unknown When did it last happen?      childhood allergy  If all above answers are "NO", may proceed with cephalosporin use.    Pineapple Nausea Only    Family History: History reviewed. No pertinent family history.  Social History:  reports that he has never smoked. He has never used smokeless tobacco. He reports that he does not drink alcohol and does not use drugs.  ROS For pertinent review of systems please refer to history of present illness  Physical Exam: BP 129/67   Pulse (!) 48   Ht 5\' 10"   (1.778 m)   Wt 222 lb (100.7 kg)   BMI 31.85 kg/m   Constitutional:  Well nourished. Alert and oriented, No acute distress. HEENT: [place] AT, mask in place.  Trachea midline Cardiovascular: No clubbing, cyanosis, or edema. Respiratory: Normal respiratory effort, no increased work of breathing. Neurologic: Grossly intact, no focal deficits, moving all 4  extremities. Psychiatric: Normal mood and affect.   Laboratory Data: Pending   Pertinent Imaging: Results for SHAMARR, FAUCETT" (MRN [id number]) as of 06/23/2021 15:28  Ref. Range 06/23/2021 15:12  Scan Result Unknown 87     Assessment & Plan:    1. Prostate cancer -PSA pending -has appointment with Dr. Donella Stade in March 2023  2. LU TS -continue tamsulosin 0.4 mg daily   Return in about 1 year (around 06/23/2022) for I PSS and PSA .  These notes generated with voice recognition software. I apologize for typographical errors.  [name], PA-C  [facility] [address]  [place], [place] [address] [phone]

## 2021-06-23 ENCOUNTER — Other Ambulatory Visit: Payer: Self-pay

## 2021-06-23 ENCOUNTER — Encounter: Payer: Self-pay | Admitting: Urology

## 2021-06-23 ENCOUNTER — Ambulatory Visit: Payer: Federal, State, Local not specified - PPO | Admitting: Urology

## 2021-06-23 VITALS — BP 129/67 | HR 48 | Ht 70.0 in | Wt 222.0 lb

## 2021-06-23 DIAGNOSIS — R350 Frequency of micturition: Secondary | ICD-10-CM | POA: Diagnosis not present

## 2021-06-23 DIAGNOSIS — C61 Malignant neoplasm of prostate: Secondary | ICD-10-CM

## 2021-06-23 LAB — BLADDER SCAN AMB NON-IMAGING: Scan Result: 87

## 2021-06-24 LAB — PSA: Prostate Specific Ag, Serum: 0.2 ng/mL (ref 0.0–4.0)

## 2021-10-22 ENCOUNTER — Other Ambulatory Visit: Payer: Self-pay

## 2021-10-22 ENCOUNTER — Inpatient Hospital Stay: Payer: Medicare Other | Attending: Radiation Oncology

## 2021-10-22 DIAGNOSIS — C61 Malignant neoplasm of prostate: Secondary | ICD-10-CM | POA: Insufficient documentation

## 2021-10-22 LAB — PSA: Prostatic Specific Antigen: 0.13 ng/mL (ref 0.00–4.00)

## 2021-10-28 ENCOUNTER — Ambulatory Visit
Admission: RE | Admit: 2021-10-28 | Discharge: 2021-10-28 | Disposition: A | Discharge status: Home / Self Care | Payer: Federal, State, Local not specified - PPO | Source: Ambulatory Visit | Attending: Radiation Oncology | Admitting: Radiation Oncology

## 2021-10-28 ENCOUNTER — Other Ambulatory Visit: Payer: Self-pay

## 2021-10-28 ENCOUNTER — Encounter: Payer: Self-pay | Admitting: Radiation Oncology

## 2021-10-28 VITALS — BP 132/66 | HR 45 | Temp 97.5°F | Resp 18 | Ht 70.0 in | Wt 224.9 lb

## 2021-10-28 DIAGNOSIS — C61 Malignant neoplasm of prostate: Secondary | ICD-10-CM | POA: Insufficient documentation

## 2021-10-28 DIAGNOSIS — Z923 Personal history of irradiation: Secondary | ICD-10-CM | POA: Insufficient documentation

## 2021-10-28 NOTE — Progress Notes (Signed)
Radiation Oncology ?Follow up Note ? ?Name: Manuel Patel   ?Date:   10/28/2021 ?MRN:  [id number] ?DOB: 11-17-1950  ? ? ?This 72 y.o. male presents to the clinic today for 2-1/2-year follow-up status post I-125 interstitial implant for Gleason 6 adenocarcinoma presenting with a PSA of 6. ? ?REFERRING PROVIDER: Albina Billet, MD ? ?HPI: Patient is a 71 year old male now out 2 and half years having completed I-125 interstitial implant for Gleason 6 adenocarcinoma the prostate presenting with a PSA of 6.  Seen today in routine follow-up he is doing well.  He specifically denies any increased lower Neri tract symptoms diarrhea or fatigue.Marland Kitchen  His PSA is stable from a year ago was 0.161-year ago and is now 0.13. ? ?COMPLICATIONS OF TREATMENT: none ? ?FOLLOW UP COMPLIANCE: keeps appointments  ? ?PHYSICAL EXAM:  ?BP 132/66 (BP Location: Left Arm, Patient Position: Sitting)   Pulse (!) 45   Temp (!) 97.5 ?F (36.4 ?C) (Tympanic)   Resp 18   Ht '5\' 10"'$  (1.778 m)   Wt 224 lb 14.4 oz (102 kg)   BMI 32.27 kg/m?  ?Well-developed well-nourished patient in NAD. HEENT reveals PERLA, EOMI, discs not visualized.  Oral cavity is clear. No oral mucosal lesions are identified. Neck is clear without evidence of cervical or supraclavicular adenopathy. Lungs are clear to A&P. Cardiac examination is essentially unremarkable with regular rate and rhythm without murmur rub or thrill. Abdomen is benign with no organomegaly or masses noted. Motor sensory and DTR levels are equal and symmetric in the upper and lower extremities. Cranial nerves II through XII are grossly intact. Proprioception is intact. No peripheral adenopathy or edema is identified. No motor or sensory levels are noted. Crude visual fields are within normal range. ? ?RADIOLOGY RESULTS: No current films for review ? ?PLAN: Present time patient remains under excellent biochemical control of his prostate cancer.  And pleased with his overall progress.  I have asked to see him back  in 1 year for follow-up with a repeat PSA at that time.  Patient is to call with any concerns. ? ?I would like to take this opportunity to thank you for allowing me to participate in the care of your patient.. ?  ? Noreene Filbert, MD ? ?

## 2021-11-07 ENCOUNTER — Other Ambulatory Visit: Payer: Self-pay | Admitting: Urology

## 2021-11-07 DIAGNOSIS — C61 Malignant neoplasm of prostate: Secondary | ICD-10-CM

## 2022-04-18 DIAGNOSIS — M1712 Unilateral primary osteoarthritis, left knee: Secondary | ICD-10-CM | POA: Insufficient documentation

## 2022-05-15 ENCOUNTER — Other Ambulatory Visit: Payer: Self-pay | Admitting: Urology

## 2022-05-15 DIAGNOSIS — C61 Malignant neoplasm of prostate: Secondary | ICD-10-CM

## 2022-05-24 NOTE — Discharge Instructions (Signed)
Instructions after Total Knee Replacement   Manuel Patel, Jr., M.D.     Dept. of Orthopaedics & Sports Medicine  [facility]  [address]  [place], [place]  [address]  Phone: [phone]   Fax: [phone]    DIET: Drink plenty of non-alcoholic fluids. Resume your normal diet. Include foods high in fiber.  ACTIVITY:  You may use crutches or a walker with weight-bearing as tolerated, unless instructed otherwise. You may be weaned off of the walker or crutches by your Physical Therapist.  Do NOT place pillows under the knee. Anything placed under the knee could limit your ability to straighten the knee.   Continue doing gentle exercises. Exercising will reduce the pain and swelling, increase motion, and prevent muscle weakness.   Please continue to use the TED compression stockings for 6 weeks. You may remove the stockings at night, but should reapply them in the morning. Do not drive or operate any equipment until instructed.  WOUND CARE:  Continue to use the PolarCare or ice packs periodically to reduce pain and swelling. You may bathe or shower after the staples are removed at the first office visit following surgery.  MEDICATIONS: You may resume your regular medications. Please take the pain medication as prescribed on the medication. Do not take pain medication on an empty stomach. You have been given a prescription for a blood thinner (Lovenox or Coumadin). Please take the medication as instructed. (NOTE: After completing a 2 week course of Lovenox, take one Enteric-coated aspirin once a day. This along with elevation will help reduce the possibility of phlebitis in your operated leg.) Do not drive or drink alcoholic beverages when taking pain medications.  CALL THE OFFICE FOR: Temperature above 101 degrees Excessive bleeding or drainage on the dressing. Excessive swelling, coldness, or paleness of the toes. Persistent nausea and vomiting.  FOLLOW-UP:  You  should have an appointment to return to the office in 10-14 days after surgery. Arrangements have been made for continuation of Physical Therapy (either home therapy or outpatient therapy).   [facility] Department Directory         [email or web address]       [email or web address]          Cardiology  Appointments: [place] - [phone] [place] - [phone]  Endocrinology  Appointments: [place] - [phone] [place] - [phone]  Gastroenterology  Appointments: [place] - [phone] [place] - [phone]        General Surgery   Appointments: [place] - [phone]  Internal Medicine/Family Medicine  Appointments: [place] - [phone] [place] - [phone] [place] - [phone]  Metabolic and Weigh Loss Surgery  Appointments: [place] - [phone]        Neurology  Appointments: [place] - [phone] [place] - [phone]  Neurosurgery  Appointments: [place] - [phone]  Obstetrics & Gynecology  Appointments: [place] - [phone] [place] - [phone]        Pediatrics  Appointments: [place] - [phone] [place] - [phone]  Physiatry  Appointments: [place] -[phone]  Physical Therapy  Appointments: [place] - [phone] [place] - [phone]        Podiatry  Appointments: [place] - [phone] [place] - [phone]  Pulmonology  Appointments: [place] - [phone]  Rheumatology  Appointments: [place] - [phone]         Location: [facility]  [address] , [place]  [address]  [place] Location: [facility] [address] [place], [place]  [address]  [place] Location: [facility] [address] [place], [place]  [address]    

## 2022-05-28 ENCOUNTER — Other Ambulatory Visit: Payer: Federal, State, Local not specified - PPO

## 2022-05-28 ENCOUNTER — Encounter
Admission: RE | Admit: 2022-05-28 | Discharge: 2022-05-28 | Disposition: A | Discharge status: Home / Self Care | Payer: Medicare Other | Source: Ambulatory Visit | Attending: Orthopedic Surgery | Admitting: Orthopedic Surgery

## 2022-05-28 VITALS — BP 170/75 | HR 45 | Resp 16 | Ht 70.0 in | Wt 214.9 lb

## 2022-05-28 DIAGNOSIS — C61 Malignant neoplasm of prostate: Secondary | ICD-10-CM

## 2022-05-28 DIAGNOSIS — M25562 Pain in left knee: Secondary | ICD-10-CM | POA: Insufficient documentation

## 2022-05-28 DIAGNOSIS — Z01818 Encounter for other preprocedural examination: Secondary | ICD-10-CM

## 2022-05-28 DIAGNOSIS — M1990 Unspecified osteoarthritis, unspecified site: Secondary | ICD-10-CM

## 2022-05-28 DIAGNOSIS — M2550 Pain in unspecified joint: Secondary | ICD-10-CM

## 2022-05-28 DIAGNOSIS — Z880 Allergy status to penicillin: Secondary | ICD-10-CM | POA: Diagnosis not present

## 2022-05-28 DIAGNOSIS — M1712 Unilateral primary osteoarthritis, left knee: Secondary | ICD-10-CM | POA: Diagnosis not present

## 2022-05-28 DIAGNOSIS — M1380 Other specified arthritis, unspecified site: Secondary | ICD-10-CM

## 2022-05-28 HISTORY — DX: Other complications of anesthesia, initial encounter: T88.59XA

## 2022-05-28 LAB — URINALYSIS, ROUTINE W REFLEX MICROSCOPIC
Bilirubin Urine: NEGATIVE
Glucose, UA: NEGATIVE mg/dL
Hgb urine dipstick: NEGATIVE
Ketones, ur: NEGATIVE mg/dL
Leukocytes,Ua: NEGATIVE
Nitrite: NEGATIVE
Protein, ur: NEGATIVE mg/dL
Specific Gravity, Urine: 1.012 (ref 1.005–1.030)
pH: 6 (ref 5.0–8.0)

## 2022-05-28 LAB — CBC
HCT: 46.5 % (ref 39.0–52.0)
Hemoglobin: 15.4 g/dL (ref 13.0–17.0)
MCH: 29.3 pg (ref 26.0–34.0)
MCHC: 33.1 g/dL (ref 30.0–36.0)
MCV: 88.4 fL (ref 80.0–100.0)
Platelets: 240 10*3/uL (ref 150–400)
RBC: 5.26 MIL/uL (ref 4.22–5.81)
RDW: 13.5 % (ref 11.5–15.5)
WBC: 9.2 10*3/uL (ref 4.0–10.5)
nRBC: 0 % (ref 0.0–0.2)

## 2022-05-28 LAB — TYPE AND SCREEN
ABO/RH(D): O POS
Antibody Screen: NEGATIVE

## 2022-05-28 LAB — COMPREHENSIVE METABOLIC PANEL
ALT: 17 U/L (ref 0–44)
AST: 22 U/L (ref 15–41)
Albumin: 4.4 g/dL (ref 3.5–5.0)
Alkaline Phosphatase: 89 U/L (ref 38–126)
Anion gap: 8 (ref 5–15)
BUN: 18 mg/dL (ref 8–23)
CO2: 28 mmol/L (ref 22–32)
Calcium: 9.7 mg/dL (ref 8.9–10.3)
Chloride: 104 mmol/L (ref 98–111)
Creatinine, Ser: 0.89 mg/dL (ref 0.61–1.24)
GFR, Estimated: >60 mL/min (ref >60)
Glucose, Bld: 87 mg/dL (ref 70–99)
Potassium: 3.7 mmol/L (ref 3.5–5.1)
Sodium: 140 mmol/L (ref 135–145)
Total Bilirubin: 1.5 mg/dL — ABNORMAL HIGH (ref 0.3–1.2)
Total Protein: 7.7 g/dL (ref 6.5–8.1)

## 2022-05-28 LAB — SURGICAL PCR SCREEN
MRSA, PCR: NEGATIVE
Staphylococcus aureus: NEGATIVE

## 2022-05-28 LAB — SEDIMENTATION RATE: Sed Rate: 4 mm/hr (ref 0–20)

## 2022-05-28 LAB — C-REACTIVE PROTEIN: CRP: 0.6 mg/dL (ref <1.0)

## 2022-05-28 LAB — PSA: Prostatic Specific Antigen: 0.08 ng/mL (ref 0.00–4.00)

## 2022-05-28 NOTE — Patient Instructions (Addendum)
Your procedure is scheduled on:06-09-22 Wednesday Report to the Registration Desk on the 1st floor of the [facility].Then proceed to the 2nd floor Surgery Desk To find out your arrival time, please call [phone] between 1PM - 3PM on:06-08-22 Tuesday If your arrival time is 6:00 am, do not arrive prior to that time as the [place] [facility] entrance doors do not open until 6:00 am.  REMEMBER: Instructions that are not followed completely may result in serious medical risk, up to and including death; or upon the discretion of your surgeon and anesthesiologist your surgery may need to be rescheduled.  Do not eat food after midnight the night before surgery.  No gum chewing, lozengers or hard candies.  You may however, drink CLEAR liquids up to 2 hours before you are scheduled to arrive for your surgery. Do not drink anything within 2 hours of your scheduled arrival time.  Clear liquids include: - water  - apple juice without pulp - gatorade (not RED colors) - black coffee or tea (Do NOT add milk or creamers to the coffee or tea) Do NOT drink anything that is not on this list.  In addition, your doctor has ordered for you to drink the provided  Ensure Pre-Surgery Clear Carbohydrate Drink  Drinking this carbohydrate drink up to two hours before surgery helps to reduce insulin resistance and improve patient outcomes. Please complete drinking 2 hours prior to scheduled arrival time.  TAKE THESE MEDICATIONS THE MORNING OF SURGERY WITH A SIP OF WATER: -atenolol (TENORMIN)  -tamsulosin (FLOMAX)   One week prior to surgery: Stop Anti-inflammatories (NSAIDS) such as Advil, Aleve, Ibuprofen, Motrin, Naproxen, Naprosyn and Aspirin based products such as Excedrin, Goodys Powder, BC Powder.You may however, continue to take Tylenol if needed for pain up until the day of surgery.  Stop ANY OVER THE COUNTER supplements/vitamins 7 days prior to surgery (Biotin and multivitamin)  No Alcohol for 24 hours  before or after surgery.  No Smoking including e-cigarettes for 24 hours prior to surgery.  No chewable tobacco products for at least 6 hours prior to surgery.  No nicotine patches on the day of surgery.  Do not use any "recreational" drugs for at least a week prior to your surgery.  Please be advised that the combination of cocaine and anesthesia may have negative outcomes, up to and including death. If you test positive for cocaine, your surgery will be cancelled.  On the morning of surgery brush your teeth with toothpaste and water, you may rinse your mouth with mouthwash if you wish. Do not swallow any toothpaste or mouthwash.  Use CHG Soap as directed on instruction sheet.  Do not wear jewelry, make-up, hairpins, clips or nail polish.  Do not wear lotions, powders, or perfumes.   Do not shave body from the neck down 48 hours prior to surgery just in case you cut yourself which could leave a site for infection.  Also, freshly shaved skin may become irritated if using the CHG soap.  Contact lenses, hearing aids and dentures may not be worn into surgery.  Do not bring valuables to the hospital. [facility] is not responsible for any missing/lost belongings or valuables.   Notify your doctor if there is any change in your medical condition (cold, fever, infection).  Wear comfortable clothing (specific to your surgery type) to the hospital.  After surgery, you can help prevent lung complications by doing breathing exercises.  Take deep breaths and cough every 1-2 hours. Your doctor may  order a device called an Incentive Spirometer to help you take deep breaths. When coughing or sneezing, hold a pillow firmly against your incision with both hands. This is called "splinting." Doing this helps protect your incision. It also decreases belly discomfort.  If you are being admitted to the hospital overnight, leave your suitcase in the car. After surgery it may be brought to your  room.  If you are being discharged the day of surgery, you will not be allowed to drive home. You will need a responsible adult (18 years or older) to drive you home and stay with you that night.   If you are taking public transportation, you will need to have a responsible adult (18 years or older) with you. Please confirm with your physician that it is acceptable to use public transportation.   Please call the [facility] Dept. at [phone] if you have any questions about these instructions.  Surgery Visitation Policy:  Patients undergoing a surgery or procedure may have two family members or support persons with them as long as the person is not COVID-19 positive or experiencing its symptoms.   Inpatient Visitation:    Visiting hours are 7 a.m. to 8 p.m. Up to four visitors are allowed at one time in a patient room, including children. The visitors may rotate out with other people during the day. One designated support person (adult) may remain overnight.    How to Use an Incentive Spirometer An incentive spirometer is a tool that measures how well you are filling your lungs with each breath. Learning to take long, deep breaths using this tool can help you keep your lungs clear and active. This may help to reverse or lessen your chance of developing breathing (pulmonary) problems, especially infection. You may be asked to use a spirometer: After a surgery. If you have a lung problem or a history of smoking. After a long period of time when you have been unable to move or be active. If the spirometer includes an indicator to show the highest number that you have reached, your health care provider or respiratory therapist will help you set a goal. Keep a log of your progress as told by your health care provider. What are the risks? Breathing too quickly may cause dizziness or cause you to pass out. Take your time so you do not get dizzy or light-headed. If you are in  pain, you may need to take pain medicine before doing incentive spirometry. It is harder to take a deep breath if you are having pain. How to use your incentive spirometer  Sit up on the edge of your bed or on a chair. Hold the incentive spirometer so that it is in an upright position. Before you use the spirometer, breathe out normally. Place the mouthpiece in your mouth. Make sure your lips are closed tightly around it. Breathe in slowly and as deeply as you can through your mouth, causing the piston or the ball to rise toward the top of the chamber. Hold your breath for 3-5 seconds, or for as long as possible. If the spirometer includes a coach indicator, use this to guide you in breathing. Slow down your breathing if the indicator goes above the marked areas. Remove the mouthpiece from your mouth and breathe out normally. The piston or ball will return to the bottom of the chamber. Rest for a few seconds, then repeat the steps 10 or more times. Take your time and take a few  normal breaths between deep breaths so that you do not get dizzy or light-headed. Do this every 1-2 hours when you are awake. If the spirometer includes a goal marker to show the highest number you have reached (best effort), use this as a goal to work toward during each repetition. After each set of 10 deep breaths, cough a few times. This will help to make sure that your lungs are clear. If you have an incision on your chest or abdomen from surgery, place a pillow or a rolled-up towel firmly against the incision when you cough. This can help to reduce pain while taking deep breaths and coughing. General tips When you are able to get out of bed: Walk around often. Continue to take deep breaths and cough in order to clear your lungs. Keep using the incentive spirometer until your health care provider says it is okay to stop using it. If you have been in the hospital, you may be told to keep using the spirometer at  home. Contact a health care provider if: You are having difficulty using the spirometer. You have trouble using the spirometer as often as instructed. Your pain medicine is not giving enough relief for you to use the spirometer as told. You have a fever. Get help right away if: You develop shortness of breath. You develop a cough with bloody mucus from the lungs. You have fluid or blood coming from an incision site after you cough. Summary An incentive spirometer is a tool that can help you learn to take long, deep breaths to keep your lungs clear and active. You may be asked to use a spirometer after a surgery, if you have a lung problem or a history of smoking, or if you have been inactive for a long period of time. Use your incentive spirometer as instructed every 1-2 hours while you are awake. If you have an incision on your chest or abdomen, place a pillow or a rolled-up towel firmly against your incision when you cough. This will help to reduce pain. Get help right away if you have shortness of breath, you cough up bloody mucus, or blood comes from your incision when you cough. This information is not intended to replace advice given to you by your health care provider. Make sure you discuss any questions you have with your health care provider. Document Revised: 10/15/2019 Document Reviewed: 10/15/2019 Elsevier Patient Education  [place].

## 2022-06-01 LAB — IGE: IgE (Immunoglobulin E), Serum: 296 IU/mL (ref 6–495)

## 2022-06-02 ENCOUNTER — Encounter: Payer: Self-pay | Admitting: Orthopedic Surgery

## 2022-06-03 NOTE — H&P (Signed)
ORTHOPAEDIC HISTORY & PHYSICAL Gwenlyn Fudge, [place] - 06/01/2022 2:00 PM EDT Formatting of this note is different from the original. [facility] Chief Complaint:   Chief Complaint  Patient presents with  Knee Pain  H & P LEFT KNEE   History of Present Illness:   Manuel Patel is a 71 y.o. male that presents to clinic today for his preoperative history and evaluation. Patient presents unaccompanied. The patient is scheduled to undergo a left total knee arthroplasty on 06/09/22 by Dr. Marry Guan. His pain began several years ago. The pain is located primarily along the anterior aspect of the knee. He describes his pain as worse with going up and down steps, rising after sitting, and walking. He reports associated swelling with some giving way of the knee. He denies associated numbness or tingling, denies locking.   The patient's symptoms have progressed to the point that they decrease his quality of life. The patient has previously undergone conservative treatment including NSAIDS and injections to the knee without adequate control of his symptoms.  Denies history of lumbar surgery, DVT, or significant cardiac history. Patient reports childhood rash to penicillin. Patient reports issue with significant constipation with use of narcotics.   Past Medical, Surgical, Family, Social History, Allergies, Medications:   Past Medical History:  Past Medical History:  Diagnosis Date  Bladder outlet obstruction 08/18/2017  Blindness of left eye  Prosthetic eye  Glaucoma (increased eye pressure)  Hypertension  Prostate cancer (CMS-HCC)   Past Surgical History:  Past Surgical History:  Procedure Laterality Date  ACDF C3-6 03/17/2016  CATARACT EXTRACTION Right  ORIF Right Ankle Right  Dr. Vickki Muff   Current Medications:  Current Outpatient Medications  Medication Sig Dispense Refill  atenolol (TENORMIN) 50 MG tablet Take 50 mg by mouth once daily.   biotin 1 mg tablet Take 1,000 mcg by mouth once daily  fluticasone (FLONASE) 50 mcg/actuation nasal spray Place 2 sprays into both nostrils as needed.   hydroCHLOROthiazide (HYDRODIURIL) 12.5 MG tablet Take 12.5 mg by mouth once daily  ibuprofen (MOTRIN) 200 MG tablet Take 600 mg by mouth as needed for Pain  olmesartan (BENICAR) 40 MG tablet Take 40 mg by mouth once daily  tamsulosin (FLOMAX) 0.4 mg capsule Take 0.4 mg by mouth once daily Take 30 minutes after same meal each day.  timolol (TIMOPTIC-XE) 0.5 % ophthalmic gel-forming solution instill 1 drop into right eye at bedtime 0  triamcinolone 0.1 % ointment Apply topically as needed   No current facility-administered medications for this visit.   Allergies:  Allergies  Allergen Reactions  Penicillin V Rash  Pineapple Nausea   Social History:  Social History   Socioeconomic History  Marital status: Married  Spouse name: Gilmore Laroche  Years of education: 12  Highest education level: High school graduate  Occupational History  Occupation: Truck Geophysicist/field seismologist: self-employed  Tobacco Use  Smoking status: Never  Smokeless tobacco: Never  Vaping Use  Vaping Use: Never used  Substance and Sexual Activity  Alcohol use: Yes  Alcohol/week: 6.0 standard drinks of alcohol  Types: 6 Cans of beer per week  Drug use: No  Sexual activity: Yes  Partners: Female   Family History:  Family History  Problem Relation Age of Onset  No Known Problems Mother  Cancer Father   Review of Systems:   A 10+ ROS was performed, reviewed, and the pertinent orthopaedic findings are documented in the HPI.   Physical Examination:   BP 120/72 (  BP Location: Left upper arm, Patient Position: Sitting, BP Cuff Size: Adult)  Ht 177.8 cm ('5\' 10"'$ )  Wt 96.1 kg (211 lb 12.8 oz)  BMI 30.39 kg/m   Patient is a well-developed, well-nourished male in no acute distress. Patient has normal mood and affect. Patient is alert and oriented to person, place, and time.    HEENT: Atraumatic, normocephalic. Pupils equal and reactive to light. Extraocular motion intact. Noninjected sclera.  Cardiovascular: Bradycardic rate and regular rhythm, with no murmurs, rubs, or gallops. Distal pulses palpable. No carotid bruits.  Respiratory: Lungs clear to auscultation bilaterally.   Left Knee: Soft tissue swelling: mild Effusion: minimal Erythema: none Crepitance: mild Tenderness: anterior and medial tenderness Alignment: normal Mediolateral laxity: stable Posterior sag: negative Patellar tracking: Good tracking without evidence of subluxation or tilt. Patellar grind test is positive Atrophy: No significant atrophy.  Quadriceps tone was fair to good. Range of motion: 0/3/115 degrees  Patient able to actively plantarflex and dorsiflex the left ankle. Able to flex and extend the toes.  Sensation intact over the saphenous, lateral sural cutaneous, superficial fibular, and deep fibular nerve distributions.  Tests Performed/Reviewed:  X-rays  No new x-rays obtained. Previous x-rays show complete loss of patellofemoral joint space with bone-on-bone contact laterally. No fractures or other osseous abnormality noted.  Impression:   ICD-10-CM  1. Primary osteoarthritis of left knee M17.12   Plan:   The patient has end-stage degenerative changes of the left knee. It was explained to the patient that the condition is progressive in nature. Having failed conservative treatment, the patient has elected to proceed with a total joint arthroplasty. The patient will undergo a total joint arthroplasty with Dr. Marry Guan. The risks of surgery, including blood clot and infection, were discussed with the patient. Measures to reduce these risks, including the use of anticoagulation, perioperative antibiotics, and early ambulation were discussed. The importance of postoperative physical therapy was discussed with the patient. The patient elects to proceed with surgery. The patient  is instructed to stop all blood thinners prior to surgery. The patient is instructed to call the hospital the day before surgery to learn of the proper arrival time.   Contact our office with any questions or concerns. Follow up as indicated, or sooner should any new problems arise, if conditions worsen, or if they are otherwise concerned.   Gwenlyn Fudge, [facility] [place], [place] [address] Phone: [phone]  This note was generated in part with voice recognition software and I apologize for any typographical errors that were not detected and corrected.  Electronically signed by Gwenlyn Fudge, PA at 06/01/2022 2:51 PM EDT

## 2022-06-09 ENCOUNTER — Encounter: Payer: Self-pay | Admitting: Orthopedic Surgery

## 2022-06-09 ENCOUNTER — Observation Stay
Admission: RE | Admit: 2022-06-09 | Discharge: 2022-06-10 | Disposition: A | Discharge status: Home / Self Care | Payer: Medicare Other | Source: Ambulatory Visit | Attending: Orthopedic Surgery | Admitting: Orthopedic Surgery

## 2022-06-09 ENCOUNTER — Ambulatory Visit: Payer: Medicare Other | Admitting: Urgent Care

## 2022-06-09 ENCOUNTER — Observation Stay: Payer: Medicare Other

## 2022-06-09 ENCOUNTER — Other Ambulatory Visit: Payer: Self-pay

## 2022-06-09 ENCOUNTER — Encounter
Admission: RE | Disposition: A | Discharge status: Home / Self Care | Payer: Self-pay | Source: Ambulatory Visit | Attending: Orthopedic Surgery

## 2022-06-09 ENCOUNTER — Ambulatory Visit: Payer: Medicare Other | Admitting: Anesthesiology

## 2022-06-09 DIAGNOSIS — M2550 Pain in unspecified joint: Secondary | ICD-10-CM

## 2022-06-09 DIAGNOSIS — M1712 Unilateral primary osteoarthritis, left knee: Principal | ICD-10-CM

## 2022-06-09 DIAGNOSIS — Z79899 Other long term (current) drug therapy: Secondary | ICD-10-CM | POA: Diagnosis not present

## 2022-06-09 DIAGNOSIS — Z8546 Personal history of malignant neoplasm of prostate: Secondary | ICD-10-CM | POA: Insufficient documentation

## 2022-06-09 DIAGNOSIS — Z880 Allergy status to penicillin: Secondary | ICD-10-CM

## 2022-06-09 DIAGNOSIS — I10 Essential (primary) hypertension: Secondary | ICD-10-CM | POA: Insufficient documentation

## 2022-06-09 DIAGNOSIS — M1990 Unspecified osteoarthritis, unspecified site: Secondary | ICD-10-CM

## 2022-06-09 DIAGNOSIS — M1380 Other specified arthritis, unspecified site: Secondary | ICD-10-CM

## 2022-06-09 DIAGNOSIS — C61 Malignant neoplasm of prostate: Secondary | ICD-10-CM

## 2022-06-09 DIAGNOSIS — Z96659 Presence of unspecified artificial knee joint: Secondary | ICD-10-CM

## 2022-06-09 HISTORY — PX: KNEE ARTHROPLASTY: SHX992

## 2022-06-09 LAB — ABO/RH: ABO/RH(D): O POS

## 2022-06-09 SURGERY — ARTHROPLASTY, KNEE, TOTAL, USING IMAGELESS COMPUTER-ASSISTED NAVIGATION
Anesthesia: Spinal | Site: Knee | Laterality: Left

## 2022-06-09 MED ORDER — CEFAZOLIN SODIUM-DEXTROSE 2-4 GM/100ML-% IV SOLN
2.0000 g | INTRAVENOUS | Status: AC
  Administered 2022-06-09: 2 g via INTRAVENOUS

## 2022-06-09 MED ORDER — MENTHOL 3 MG MT LOZG: 1.0000 | LOZENGE | OROMUCOSAL | Status: DC | PRN

## 2022-06-09 MED ORDER — FLEET ENEMA 7-19 GM/118ML RE ENEM: 1.0000 | ENEMA | Freq: Once | RECTAL | Status: DC | PRN

## 2022-06-09 MED ORDER — FAMOTIDINE 20 MG PO TABS
ORAL_TABLET | ORAL | Status: AC
  Administered 2022-06-09: 20 mg via ORAL
  Filled 2022-06-09: qty 1

## 2022-06-09 MED ORDER — HYDROCHLOROTHIAZIDE 12.5 MG PO TABS
12.5000 mg | ORAL_TABLET | Freq: Every day | ORAL | Status: DC
  Administered 2022-06-10: 12.5 mg via ORAL
  Filled 2022-06-09: qty 1

## 2022-06-09 MED ORDER — FERROUS SULFATE 325 (65 FE) MG PO TABS
325.0000 mg | ORAL_TABLET | Freq: Two times a day (BID) | ORAL | Status: DC
  Administered 2022-06-10: 325 mg via ORAL
  Filled 2022-06-09: qty 1

## 2022-06-09 MED ORDER — ENOXAPARIN SODIUM 30 MG/0.3ML IJ SOSY
30.0000 mg | PREFILLED_SYRINGE | Freq: Two times a day (BID) | INTRAMUSCULAR | Status: DC
  Administered 2022-06-10: 30 mg via SUBCUTANEOUS
  Filled 2022-06-09: qty 0.3

## 2022-06-09 MED ORDER — ONDANSETRON HCL 4 MG/2ML IJ SOLN
INTRAMUSCULAR | Status: DC | PRN
  Administered 2022-06-09: 4 mg via INTRAVENOUS

## 2022-06-09 MED ORDER — TRANEXAMIC ACID-NACL 1000-0.7 MG/100ML-% IV SOLN
INTRAVENOUS | Status: AC
  Filled 2022-06-09: qty 100

## 2022-06-09 MED ORDER — OXYCODONE HCL 5 MG PO TABS
10.0000 mg | ORAL_TABLET | ORAL | Status: DC | PRN
  Administered 2022-06-09 – 2022-06-10 (×2): 10 mg via ORAL
  Filled 2022-06-09 (×2): qty 2

## 2022-06-09 MED ORDER — TAMSULOSIN HCL 0.4 MG PO CAPS
0.4000 mg | ORAL_CAPSULE | Freq: Every day | ORAL | Status: DC
  Administered 2022-06-10: 0.4 mg via ORAL
  Filled 2022-06-09: qty 1

## 2022-06-09 MED ORDER — ACETAMINOPHEN 10 MG/ML IV SOLN
INTRAVENOUS | Status: AC
  Filled 2022-06-09: qty 100

## 2022-06-09 MED ORDER — ADULT MULTIVITAMIN W/MINERALS CH
1.0000 | ORAL_TABLET | Freq: Three times a day (TID) | ORAL | Status: DC
  Administered 2022-06-09 – 2022-06-10 (×3): 1 via ORAL
  Filled 2022-06-09 (×3): qty 1

## 2022-06-09 MED ORDER — CHLORHEXIDINE GLUCONATE 0.12 % MT SOLN: 15.0000 mL | Freq: Once | OROMUCOSAL | Status: AC

## 2022-06-09 MED ORDER — ACETAMINOPHEN 10 MG/ML IV SOLN
1000.0000 mg | Freq: Four times a day (QID) | INTRAVENOUS | Status: AC
  Administered 2022-06-09 – 2022-06-10 (×4): 1000 mg via INTRAVENOUS
  Filled 2022-06-09 (×5): qty 100

## 2022-06-09 MED ORDER — TRAMADOL HCL 50 MG PO TABS: 50.0000 mg | ORAL_TABLET | ORAL | Status: DC | PRN

## 2022-06-09 MED ORDER — PHENYLEPHRINE HCL (PRESSORS) 10 MG/ML IV SOLN
INTRAVENOUS | Status: AC
  Filled 2022-06-09: qty 1

## 2022-06-09 MED ORDER — CEFAZOLIN SODIUM-DEXTROSE 2-4 GM/100ML-% IV SOLN
INTRAVENOUS | Status: AC
  Administered 2022-06-10: 2 g via INTRAVENOUS
  Filled 2022-06-09: qty 100

## 2022-06-09 MED ORDER — GABAPENTIN 300 MG PO CAPS: 300.0000 mg | ORAL_CAPSULE | Freq: Once | ORAL | Status: AC

## 2022-06-09 MED ORDER — PHENOL 1.4 % MT LIQD: 1.0000 | OROMUCOSAL | Status: DC | PRN

## 2022-06-09 MED ORDER — MAGNESIUM HYDROXIDE 400 MG/5ML PO SUSP
30.0000 mL | Freq: Every day | ORAL | Status: DC
  Administered 2022-06-09 – 2022-06-10 (×2): 30 mL via ORAL
  Filled 2022-06-09 (×2): qty 30

## 2022-06-09 MED ORDER — FENTANYL CITRATE (PF) 100 MCG/2ML IJ SOLN: 25.0000 ug | INTRAMUSCULAR | Status: DC | PRN

## 2022-06-09 MED ORDER — SODIUM CHLORIDE (PF) 0.9 % IJ SOLN
INTRAMUSCULAR | Status: DC | PRN
  Administered 2022-06-09: 120 mL via INTRAMUSCULAR

## 2022-06-09 MED ORDER — METOCLOPRAMIDE HCL 5 MG PO TABS
10.0000 mg | ORAL_TABLET | Freq: Three times a day (TID) | ORAL | Status: DC
  Administered 2022-06-10 (×2): 10 mg via ORAL
  Filled 2022-06-09 (×2): qty 2

## 2022-06-09 MED ORDER — ATENOLOL 25 MG PO TABS
50.0000 mg | ORAL_TABLET | Freq: Two times a day (BID) | ORAL | Status: DC
  Administered 2022-06-09 – 2022-06-10 (×2): 50 mg via ORAL
  Filled 2022-06-09 (×2): qty 2

## 2022-06-09 MED ORDER — GLYCOPYRROLATE 0.2 MG/ML IJ SOLN
INTRAMUSCULAR | Status: DC | PRN
  Administered 2022-06-09: .2 mg via INTRAVENOUS

## 2022-06-09 MED ORDER — OXYCODONE HCL 5 MG PO TABS: 5.0000 mg | ORAL_TABLET | ORAL | Status: DC | PRN

## 2022-06-09 MED ORDER — ENSURE PRE-SURGERY PO LIQD
296.0000 mL | Freq: Once | ORAL | Status: AC
  Administered 2022-06-09: 296 mL via ORAL
  Filled 2022-06-09: qty 296

## 2022-06-09 MED ORDER — HYDROMORPHONE HCL 1 MG/ML IJ SOLN: 0.5000 mg | INTRAMUSCULAR | Status: DC | PRN

## 2022-06-09 MED ORDER — BISACODYL 10 MG RE SUPP: 10.0000 mg | Freq: Every day | RECTAL | Status: DC | PRN

## 2022-06-09 MED ORDER — DEXAMETHASONE SODIUM PHOSPHATE 10 MG/ML IJ SOLN: 8.0000 mg | Freq: Once | INTRAMUSCULAR | Status: AC

## 2022-06-09 MED ORDER — MIDAZOLAM HCL 5 MG/5ML IJ SOLN
INTRAMUSCULAR | Status: DC | PRN
  Administered 2022-06-09: 2 mg via INTRAVENOUS

## 2022-06-09 MED ORDER — TRANEXAMIC ACID-NACL 1000-0.7 MG/100ML-% IV SOLN: 1000.0000 mg | Freq: Once | INTRAVENOUS | Status: AC

## 2022-06-09 MED ORDER — SODIUM CHLORIDE 0.9 % IV SOLN: INTRAVENOUS | Status: DC

## 2022-06-09 MED ORDER — BUPIVACAINE HCL (PF) 0.5 % IJ SOLN
INTRAMUSCULAR | Status: DC | PRN
  Administered 2022-06-09: 3 mL

## 2022-06-09 MED ORDER — FLUTICASONE PROPIONATE 50 MCG/ACT NA SUSP: 1.0000 | Freq: Every day | NASAL | Status: DC | PRN

## 2022-06-09 MED ORDER — FAMOTIDINE 20 MG PO TABS: 20.0000 mg | ORAL_TABLET | Freq: Once | ORAL | Status: AC

## 2022-06-09 MED ORDER — TRANEXAMIC ACID-NACL 1000-0.7 MG/100ML-% IV SOLN
1000.0000 mg | INTRAVENOUS | Status: AC
  Administered 2022-06-09: 1000 mg via INTRAVENOUS

## 2022-06-09 MED ORDER — SODIUM CHLORIDE 0.9 % IR SOLN
Status: DC | PRN
  Administered 2022-06-09: 3000 mL

## 2022-06-09 MED ORDER — IRBESARTAN 150 MG PO TABS
300.0000 mg | ORAL_TABLET | Freq: Every day | ORAL | Status: DC
  Administered 2022-06-09 – 2022-06-10 (×2): 300 mg via ORAL
  Filled 2022-06-09 (×2): qty 2

## 2022-06-09 MED ORDER — CELECOXIB 200 MG PO CAPS
ORAL_CAPSULE | ORAL | Status: AC
  Administered 2022-06-09: 400 mg via ORAL
  Filled 2022-06-09: qty 2

## 2022-06-09 MED ORDER — GABAPENTIN 300 MG PO CAPS
ORAL_CAPSULE | ORAL | Status: AC
  Administered 2022-06-09: 300 mg via ORAL
  Filled 2022-06-09: qty 1

## 2022-06-09 MED ORDER — PROPOFOL 500 MG/50ML IV EMUL
INTRAVENOUS | Status: DC | PRN
  Administered 2022-06-09: 200 ug/kg/min via INTRAVENOUS

## 2022-06-09 MED ORDER — SENNOSIDES-DOCUSATE SODIUM 8.6-50 MG PO TABS
1.0000 | ORAL_TABLET | Freq: Two times a day (BID) | ORAL | Status: DC
  Administered 2022-06-09 – 2022-06-10 (×2): 1 via ORAL
  Filled 2022-06-09 (×2): qty 1

## 2022-06-09 MED ORDER — CHLORHEXIDINE GLUCONATE 0.12 % MT SOLN
OROMUCOSAL | Status: AC
  Administered 2022-06-09: 15 mL via OROMUCOSAL
  Filled 2022-06-09: qty 15

## 2022-06-09 MED ORDER — CHLORHEXIDINE GLUCONATE 4 % EX LIQD: 60.0000 mL | Freq: Once | CUTANEOUS | Status: DC

## 2022-06-09 MED ORDER — CEFAZOLIN SODIUM-DEXTROSE 2-4 GM/100ML-% IV SOLN
2.0000 g | Freq: Four times a day (QID) | INTRAVENOUS | Status: AC
  Administered 2022-06-09: 2 g via INTRAVENOUS
  Filled 2022-06-09 (×2): qty 100

## 2022-06-09 MED ORDER — ALUM & MAG HYDROXIDE-SIMETH 200-200-20 MG/5ML PO SUSP: 30.0000 mL | ORAL | Status: DC | PRN

## 2022-06-09 MED ORDER — ONDANSETRON HCL 4 MG/2ML IJ SOLN: 4.0000 mg | Freq: Four times a day (QID) | INTRAMUSCULAR | Status: DC | PRN

## 2022-06-09 MED ORDER — TRANEXAMIC ACID-NACL 1000-0.7 MG/100ML-% IV SOLN
INTRAVENOUS | Status: AC
  Administered 2022-06-09: 1000 mg via INTRAVENOUS
  Filled 2022-06-09: qty 100

## 2022-06-09 MED ORDER — ACETAMINOPHEN 10 MG/ML IV SOLN
INTRAVENOUS | Status: DC | PRN
  Administered 2022-06-09: 1000 mg via INTRAVENOUS

## 2022-06-09 MED ORDER — MIDAZOLAM HCL 2 MG/2ML IJ SOLN
INTRAMUSCULAR | Status: AC
  Filled 2022-06-09: qty 2

## 2022-06-09 MED ORDER — PROPOFOL 1000 MG/100ML IV EMUL
INTRAVENOUS | Status: AC
  Filled 2022-06-09: qty 100

## 2022-06-09 MED ORDER — SURGIPHOR WOUND IRRIGATION SYSTEM - OPTIME: TOPICAL | Status: DC | PRN

## 2022-06-09 MED ORDER — PANTOPRAZOLE SODIUM 40 MG PO TBEC
40.0000 mg | DELAYED_RELEASE_TABLET | Freq: Two times a day (BID) | ORAL | Status: DC
  Administered 2022-06-09 – 2022-06-10 (×2): 40 mg via ORAL
  Filled 2022-06-09 (×2): qty 1

## 2022-06-09 MED ORDER — CELECOXIB 200 MG PO CAPS: 400.0000 mg | ORAL_CAPSULE | Freq: Once | ORAL | Status: AC

## 2022-06-09 MED ORDER — ONDANSETRON HCL 4 MG PO TABS: 4.0000 mg | ORAL_TABLET | Freq: Four times a day (QID) | ORAL | Status: DC | PRN

## 2022-06-09 MED ORDER — ACETAMINOPHEN 325 MG PO TABS: 325.0000 mg | ORAL_TABLET | Freq: Four times a day (QID) | ORAL | Status: DC | PRN

## 2022-06-09 MED ORDER — LACTATED RINGERS IV SOLN: INTRAVENOUS | Status: DC

## 2022-06-09 MED ORDER — CELECOXIB 200 MG PO CAPS
200.0000 mg | ORAL_CAPSULE | Freq: Two times a day (BID) | ORAL | Status: DC
  Administered 2022-06-09 – 2022-06-10 (×2): 200 mg via ORAL
  Filled 2022-06-09 (×2): qty 1

## 2022-06-09 MED ORDER — ORAL CARE MOUTH RINSE: 15.0000 mL | Freq: Once | OROMUCOSAL | Status: AC

## 2022-06-09 MED ORDER — TIMOLOL MALEATE 0.5 % OP SOLN
1.0000 [drp] | Freq: Every day | OPHTHALMIC | Status: DC
  Administered 2022-06-09: 1 [drp] via OPHTHALMIC
  Filled 2022-06-09: qty 5

## 2022-06-09 MED ORDER — PHENYLEPHRINE HCL-NACL 20-0.9 MG/250ML-% IV SOLN
INTRAVENOUS | Status: DC | PRN
  Administered 2022-06-09: 25 ug/min via INTRAVENOUS

## 2022-06-09 MED ORDER — DIPHENHYDRAMINE HCL 12.5 MG/5ML PO ELIX: 12.5000 mg | ORAL_SOLUTION | ORAL | Status: DC | PRN

## 2022-06-09 MED ORDER — PROPOFOL 10 MG/ML IV BOLUS
INTRAVENOUS | Status: AC
  Filled 2022-06-09: qty 20

## 2022-06-09 MED ORDER — DEXAMETHASONE SODIUM PHOSPHATE 10 MG/ML IJ SOLN
INTRAMUSCULAR | Status: AC
  Administered 2022-06-09: 8 mg via INTRAVENOUS
  Filled 2022-06-09: qty 1

## 2022-06-09 SURGICAL SUPPLY — 72 items
ATTUNE MED DOME PAT 38 KNEE (Knees) IMPLANT
ATTUNE PS FEM LT SZ 4 CEM KNEE (Femur) IMPLANT
ATTUNE PSRP INSR SZ4 5 KNEE (Insert) IMPLANT
BASE TIBIAL ROT PLAT SZ 5 KNEE (Knees) IMPLANT
BATTERY INSTRU NAVIGATION (MISCELLANEOUS) ×4 IMPLANT
BLADE CLIPPER SURG (BLADE) IMPLANT
BLADE SAW 70X12.5 (BLADE) ×1 IMPLANT
BLADE SAW 90X13X1.19 OSCILLAT (BLADE) ×1 IMPLANT
BLADE SAW 90X25X1.19 OSCILLAT (BLADE) ×1 IMPLANT
BONE CEMENT GENTAMICIN (Cement) ×2 IMPLANT
CEMENT BONE GENTAMICIN 40 (Cement) IMPLANT
COOLER POLAR GLACIER W/PUMP (MISCELLANEOUS) ×1 IMPLANT
CUFF TOURN SGL QUICK 24 (TOURNIQUET CUFF)
CUFF TOURN SGL QUICK 34 (TOURNIQUET CUFF)
CUFF TRNQT CYL 24X4X16.5-23 (TOURNIQUET CUFF) IMPLANT
CUFF TRNQT CYL 34X4.125X (TOURNIQUET CUFF) IMPLANT
DRAPE 3/4 80X56 (DRAPES) ×1 IMPLANT
DRAPE INCISE IOBAN 66X45 STRL (DRAPES) IMPLANT
DRSG DERMACEA NONADH 3X8 (GAUZE/BANDAGES/DRESSINGS) ×1 IMPLANT
DRSG MEPILEX SACRM 8.7X9.8 (GAUZE/BANDAGES/DRESSINGS) ×1 IMPLANT
DRSG OPSITE POSTOP 4X14 (GAUZE/BANDAGES/DRESSINGS) ×1 IMPLANT
DRSG TEGADERM 4X4.75 (GAUZE/BANDAGES/DRESSINGS) ×1 IMPLANT
DURAPREP 26ML APPLICATOR (WOUND CARE) ×2 IMPLANT
ELECT CAUTERY BLADE 6.4 (BLADE) ×1 IMPLANT
ELECT REM PT RETURN 9FT ADLT (ELECTROSURGICAL) ×1
ELECTRODE REM PT RTRN 9FT ADLT (ELECTROSURGICAL) ×1 IMPLANT
EX-PIN ORTHOLOCK NAV 4X150 (PIN) ×2 IMPLANT
GLOVE BIOGEL M STRL SZ7.5 (GLOVE) ×2 IMPLANT
GLOVE BIOGEL PI IND STRL 7.5 (GLOVE) ×1 IMPLANT
GLOVE PI ORTHO PRO STRL 7.5 (GLOVE) ×2 IMPLANT
GLOVE SURG UNDER POLY LF SZ7.5 (GLOVE) ×1 IMPLANT
GOWN STRL REUS W/ TWL LRG LVL3 (GOWN DISPOSABLE) ×2 IMPLANT
GOWN STRL REUS W/ TWL XL LVL3 (GOWN DISPOSABLE) ×1 IMPLANT
GOWN STRL REUS W/TWL LRG LVL3 (GOWN DISPOSABLE) ×2
GOWN STRL REUS W/TWL XL LVL3 (GOWN DISPOSABLE) ×1
HEMOVAC 400CC 10FR (MISCELLANEOUS) ×1 IMPLANT
HOLDER FOLEY CATH W/STRAP (MISCELLANEOUS) ×1 IMPLANT
HOOD PEEL AWAY T7 (MISCELLANEOUS) ×2 IMPLANT
IV NS IRRIG 3000ML ARTHROMATIC (IV SOLUTION) ×1 IMPLANT
KIT TURNOVER KIT A (KITS) ×1 IMPLANT
KNIFE SCULPS 14X20 (INSTRUMENTS) ×1 IMPLANT
MANIFOLD NEPTUNE II (INSTRUMENTS) ×2 IMPLANT
NDL SPNL 20GX3.5 QUINCKE YW (NEEDLE) ×2 IMPLANT
NEEDLE SPNL 20GX3.5 QUINCKE YW (NEEDLE) ×2 IMPLANT
NS IRRIG 500ML POUR BTL (IV SOLUTION) ×1 IMPLANT
PACK TOTAL KNEE (MISCELLANEOUS) ×1 IMPLANT
PAD ABD DERMACEA PRESS 5X9 (GAUZE/BANDAGES/DRESSINGS) ×2 IMPLANT
PAD WRAPON POLAR KNEE (MISCELLANEOUS) ×1 IMPLANT
PIN DRILL FIX HALF THREAD (BIT) ×2 IMPLANT
PIN FIXATION 1/8DIA X 3INL (PIN) ×1 IMPLANT
PULSAVAC PLUS IRRIG FAN TIP (DISPOSABLE) ×1
SOL PREP PVP 2OZ (MISCELLANEOUS) ×1
SOLUTION IRRIG SURGIPHOR (IV SOLUTION) ×1 IMPLANT
SOLUTION PREP PVP 2OZ (MISCELLANEOUS) ×1 IMPLANT
SPONGE DRAIN TRACH 4X4 STRL 2S (GAUZE/BANDAGES/DRESSINGS) ×1 IMPLANT
STAPLER SKIN PROX 35W (STAPLE) ×1 IMPLANT
STOCKINETTE IMPERV 14X48 (MISCELLANEOUS) IMPLANT
STRAP TIBIA SHORT (MISCELLANEOUS) ×1 IMPLANT
SUCTION FRAZIER HANDLE 10FR (MISCELLANEOUS) ×1
SUCTION TUBE FRAZIER 10FR DISP (MISCELLANEOUS) ×1 IMPLANT
SUT VIC AB 0 CT1 36 (SUTURE) ×2 IMPLANT
SUT VIC AB 1 CT1 36 (SUTURE) ×2 IMPLANT
SUT VIC AB 2-0 CT2 27 (SUTURE) ×1 IMPLANT
SYR 30ML LL (SYRINGE) ×2 IMPLANT
TIBIAL BASE ROT PLAT SZ 5 KNEE (Knees) ×1 IMPLANT
TIP FAN IRRIG PULSAVAC PLUS (DISPOSABLE) ×1 IMPLANT
TOWEL OR 17X26 4PK STRL BLUE (TOWEL DISPOSABLE) IMPLANT
TOWER CARTRIDGE SMART MIX (DISPOSABLE) ×1 IMPLANT
TRAP FLUID SMOKE EVACUATOR (MISCELLANEOUS) ×1 IMPLANT
TRAY FOLEY MTR SLVR 16FR STAT (SET/KITS/TRAYS/PACK) ×1 IMPLANT
WATER STERILE IRR 1000ML POUR (IV SOLUTION) ×1 IMPLANT
WRAPON POLAR PAD KNEE (MISCELLANEOUS) ×1

## 2022-06-09 NOTE — Interval H&P Note (Signed)
History and Physical Interval Note:  06/09/2022 11:41 AM  Manuel Patel  has presented today for surgery, with the diagnosis of PRIMARY OSTEOARTHRITIS OF LEFT KNEE..  The various methods of treatment have been discussed with the patient and family. After consideration of risks, benefits and other options for treatment, the patient has consented to  Procedure(s): COMPUTER ASSISTED TOTAL KNEE ARTHROPLASTY (Left) as a surgical intervention.  The patient's history has been reviewed, patient examined, no change in status, stable for surgery.  I have reviewed the patient's chart and labs.  Questions were answered to the patient's satisfaction.     [place]

## 2022-06-09 NOTE — Op Note (Signed)
OPERATIVE NOTE  DATE OF SURGERY:  06/09/2022  PATIENT NAME:  Emani Morad   DOB: 12-10-1950  MRN: [id number]  PRE-OPERATIVE DIAGNOSIS: Degenerative arthrosis of the left knee, primary  POST-OPERATIVE DIAGNOSIS:  Same  PROCEDURE:  Left total knee arthroplasty using computer-assisted navigation  SURGEON:  Marciano Sequin. M.D.  ASSISTANT: Cassell Smiles, PA-C (present and scrubbed throughout the case, critical for assistance with exposure, retraction, instrumentation, and closure)  ANESTHESIA: spinal  ESTIMATED BLOOD LOSS: 50 mL  FLUIDS REPLACED: 1500 mL of crystalloid  TOURNIQUET TIME: 93 minutes  DRAINS: 2 medium Hemovac drains  SOFT TISSUE RELEASES: Anterior cruciate ligament, posterior cruciate ligament, deep medial collateral ligament, patellofemoral ligament  IMPLANTS UTILIZED: DePuy Attune size 4 posterior stabilized femoral component (cemented), size 5 rotating platform tibial component (cemented), 38 mm medialized dome patella (cemented), and a 5 mm stabilized rotating platform polyethylene insert.  INDICATIONS FOR SURGERY: Corban Kistler is a 71 y.o. year old male with a long history of progressive knee pain. X-rays demonstrated severe degenerative changes in tricompartmental fashion. The patient had not seen any significant improvement despite conservative nonsurgical intervention. After discussion of the risks and benefits of surgical intervention, the patient expressed understanding of the risks benefits and agree with plans for total knee arthroplasty.   The risks, benefits, and alternatives were discussed at length including but not limited to the risks of infection, bleeding, nerve injury, stiffness, blood clots, the need for revision surgery, cardiopulmonary complications, among others, and they were willing to proceed.  PROCEDURE IN DETAIL: The patient was brought into the operating room and, after adequate spinal anesthesia was achieved, a tourniquet was placed on the  patient's upper thigh. The patient's knee and leg were cleaned and prepped with alcohol and DuraPrep and draped in the usual sterile fashion. A "timeout" was performed as per usual protocol. The lower extremity was exsanguinated using an Esmarch, and the tourniquet was inflated to 300 mmHg. An anterior longitudinal incision was made followed by a standard mid vastus approach. The deep fibers of the medial collateral ligament were elevated in a subperiosteal fashion off of the medial flare of the tibia so as to maintain a continuous soft tissue sleeve. The patella was subluxed laterally and the patellofemoral ligament was incised. Inspection of the knee demonstrated severe degenerative changes with full-thickness loss of articular cartilage. Osteophytes were debrided using a rongeur. Anterior and posterior cruciate ligaments were excised. Two 4.0 mm Schanz pins were inserted in the femur and into the tibia for attachment of the array of trackers used for computer-assisted navigation. Hip center was identified using a circumduction technique. Distal landmarks were mapped using the computer. The distal femur and proximal tibia were mapped using the computer. The distal femoral cutting guide was positioned using computer-assisted navigation so as to achieve a 5 distal valgus cut. The femur was sized and it was felt that a size 4 femoral component was appropriate. A size 4 femoral cutting guide was positioned and the anterior cut was performed and verified using the computer. This was followed by completion of the posterior and chamfer cuts. Femoral cutting guide for the central box was then positioned in the center box cut was performed.  Attention was then directed to the proximal tibia. Medial and lateral menisci were excised. The extramedullary tibial cutting guide was positioned using computer-assisted navigation so as to achieve a 0 varus-valgus alignment and 3 posterior slope. The cut was performed and  verified using the computer. The proximal tibia was  sized and it was felt that a size 5 tibial tray was appropriate. Tibial and femoral trials were inserted followed by insertion of a 5 mm polyethylene insert. This allowed for excellent mediolateral soft tissue balancing both in flexion and in full extension. Finally, the patella was cut and prepared so as to accommodate a 38 mm medialized dome patella. A patella trial was placed and the knee was placed through a range of motion with excellent patellar tracking appreciated. The femoral trial was removed after debridement of posterior osteophytes. The central post-hole for the tibial component was reamed followed by insertion of a keel punch. Tibial trials were then removed. Cut surfaces of bone were irrigated with copious amounts of normal saline using pulsatile lavage and then suctioned dry. Polymethylmethacrylate cement with gentamicin was prepared in the usual fashion using a vacuum mixer. Cement was applied to the cut surface of the proximal tibia as well as along the undersurface of a size 5 rotating platform tibial component. Tibial component was positioned and impacted into place. Excess cement was removed using [employer] fast streamer. Cement was then applied to the cut surfaces of the femur as well as along the posterior flanges of the size 4 femoral component. The femoral component was positioned and impacted into place. Excess cement was removed using [employer] fast streamer. A 5 mm polyethylene trial was inserted and the knee was brought into full extension with steady axial compression applied. Finally, cement was applied to the backside of a 38 mm medialized dome patella and the patellar component was positioned and patellar clamp applied. Excess cement was removed using [employer] fast streamer. After adequate curing of the cement, the tourniquet was deflated after a total tourniquet time of 93 minutes. Hemostasis was achieved using electrocautery. The knee was irrigated with  copious amounts of normal saline using pulsatile lavage followed by 450 ml of Surgiphor and then suctioned dry. 20 mL of 1.3% Exparel and 60 mL of 0.25% Marcaine in 40 mL of normal saline was injected along the posterior capsule, medial and lateral gutters, and along the arthrotomy site. A 5 mm stabilized rotating platform polyethylene insert was inserted and the knee was placed through a range of motion with excellent mediolateral soft tissue balancing appreciated and excellent patellar tracking noted. 2 medium drains were placed in the wound bed and brought out through separate stab incisions. The medial parapatellar portion of the incision was reapproximated using interrupted sutures of #1 Vicryl. Subcutaneous tissue was approximated in layers using first #0 Vicryl followed #2-0 Vicryl. The skin was approximated with skin staples. A sterile dressing was applied.  The patient tolerated the procedure well and was transported to the recovery room in stable condition.    Thomasenia Dowse P. Holley Bouche., M.D.

## 2022-06-09 NOTE — Anesthesia Procedure Notes (Addendum)
Spinal  Patient location during procedure: OR Start time: 06/09/2022 12:14 PM End time: 06/09/2022 12:18 PM Reason for block: surgical anesthesia Staffing Performed: resident/CRNA  Resident/CRNA: Nelda Marseille, CRNA Performed by: Nelda Marseille, CRNA Authorized by: Martha Clan, MD   Preanesthetic Checklist Completed: patient identified, IV checked, site marked, risks and benefits discussed, surgical consent, monitors and equipment checked, pre-op evaluation and timeout performed Spinal Block Patient position: sitting Prep: ChloraPrep Patient monitoring: heart rate, continuous pulse ox, blood pressure and cardiac monitor Approach: midline Location: L3-4 Injection technique: single-shot Needle Needle type: Whitacre and Introducer  Needle gauge: 25 G Needle length: 9 cm Assessment Sensory level: T10 Events: CSF return Additional Notes Sterile aseptic technique used throughout the procedure.  Negative paresthesia. Negative blood return. Positive free-flowing CSF. Expiration date of kit checked and confirmed. Patient tolerated procedure well, without complications.

## 2022-06-09 NOTE — Anesthesia Procedure Notes (Signed)
Date/Time: 06/09/2022 1:05 PM  Performed by: Nelda Marseille, CRNAPre-anesthesia Checklist: Patient identified, Emergency Drugs available, Suction available, Patient being monitored and Timeout performed Oxygen Delivery Method: Simple face mask

## 2022-06-09 NOTE — Transfer of Care (Signed)
Immediate Anesthesia Transfer of Care Note  Patient: Manuel Patel  Procedure(s) Performed: COMPUTER ASSISTED TOTAL KNEE ARTHROPLASTY (Left: Knee)  Patient Location: PACU  Anesthesia Type:Spinal  Level of Consciousness: awake, alert  and oriented  Airway & Oxygen Therapy: Patient Spontanous Breathing and Patient connected to face mask oxygen  Post-op Assessment: Report given to RN and Post -op Vital signs reviewed and stable  Post vital signs: Reviewed and stable  Last Vitals:  Vitals Value Taken Time  BP 131/72 06/09/22 1602  Temp    Pulse 55 06/09/22 1606  Resp 13 06/09/22 1606  SpO2 94 % 06/09/22 1606  Vitals shown include unvalidated device data.  Last Pain:  Vitals:   06/09/22 1048  TempSrc: Tympanic  PainSc: 0-No pain         Complications: No notable events documented.

## 2022-06-09 NOTE — Anesthesia Preprocedure Evaluation (Signed)
Anesthesia Evaluation  Patient identified by MRN, date of birth, ID band Patient awake    Reviewed: Allergy & Precautions, H&P , NPO status , Patient's Chart, lab work & pertinent test results, reviewed documented beta blocker date and time   Airway Mallampati: III  TM Distance: >3 FB Neck ROM: full    Dental  (+) Caps, Dental Advidsory Given   Pulmonary neg pulmonary ROS,    Pulmonary exam normal breath sounds clear to auscultation       Cardiovascular Exercise Tolerance: Good hypertension, (-) angina(-) Past MI and (-) Cardiac Stents Normal cardiovascular exam(-) dysrhythmias (-) Valvular Problems/Murmurs Rhythm:regular Rate:Normal     Neuro/Psych negative neurological ROS  negative psych ROS   GI/Hepatic negative GI ROS, Neg liver ROS,   Endo/Other  negative endocrine ROS  Renal/GU negative Renal ROS  negative genitourinary   Musculoskeletal   Abdominal   Peds  Hematology negative hematology ROS (+)   Anesthesia Other Findings Past Medical History: No date: Blind left eye No date: Bronchitis No date: Bronchitis 2019: Cancer ([place])     Comment:  prostate No date: Complication of anesthesia     Comment:  severe constipation after ankle surgery and had to come               to ed No date: Glaucoma, right eye No date: History of bronchitis No date: History of kidney stones No date: Hypertension No date: Numbness and tingling in left arm   Reproductive/Obstetrics negative OB ROS                             Anesthesia Physical Anesthesia Plan  ASA: 2  Anesthesia Plan: Spinal   Post-op Pain Management:    Induction:   PONV Risk Score and Plan: 1 and Propofol infusion and TIVA  Airway Management Planned: Natural Airway and Simple Face Mask  Additional Equipment:   Intra-op Plan:   Post-operative Plan:   Informed Consent: I have reviewed the patients History and Physical,  chart, labs and discussed the procedure including the risks, benefits and alternatives for the proposed anesthesia with the patient or authorized representative who has indicated his/her understanding and acceptance.     Dental Advisory Given  Plan Discussed with: Anesthesiologist, CRNA and Surgeon  Anesthesia Plan Comments:         Anesthesia Quick Evaluation

## 2022-06-09 NOTE — Progress Notes (Signed)
Pt stood on side of bed to void

## 2022-06-10 ENCOUNTER — Encounter: Payer: Self-pay | Admitting: Orthopedic Surgery

## 2022-06-10 DIAGNOSIS — M1712 Unilateral primary osteoarthritis, left knee: Secondary | ICD-10-CM | POA: Diagnosis not present

## 2022-06-10 MED ORDER — OXYCODONE HCL 5 MG PO TABS: 5.0000 mg | ORAL_TABLET | ORAL | 0 refills | Status: AC | PRN

## 2022-06-10 MED ORDER — TRAMADOL HCL 50 MG PO TABS: 50.0000 mg | ORAL_TABLET | ORAL | 0 refills | Status: DC | PRN

## 2022-06-10 MED ORDER — ENOXAPARIN SODIUM 40 MG/0.4ML IJ SOSY: 40.0000 mg | PREFILLED_SYRINGE | INTRAMUSCULAR | 0 refills | Status: DC

## 2022-06-10 MED ORDER — CELECOXIB 200 MG PO CAPS: 200.0000 mg | ORAL_CAPSULE | Freq: Two times a day (BID) | ORAL | 0 refills | Status: DC

## 2022-06-10 NOTE — Evaluation (Addendum)
Occupational Therapy Evaluation Patient Details Name: Manuel Patel MRN: [id number] DOB: 12-Sep-1950 Today's Date: 06/10/2022   History of Present Illness Manuel Patel is a 71 y.o. male that presents to clinic today for his preoperative history and evaluation. Patient presents unaccompanied. The patient is scheduled to undergo a left total knee arthroplasty on 06/09/22 by Dr. Marry Guan. His pain began several years ago. The pain is located primarily along the anterior aspect of the knee. He describes his pain as worse with going up and down steps, rising after sitting, and walking. He reports associated swelling with some giving way of the knee. He denies associated numbness or tingling, denies locking.   Clinical Impression   Patient presenting with decreased independence in self-care, functional mobility, safety, strength, and endurance. Patient reports he lives with his wife who is able to assist him 24/7. Patients he has 2 stairs at the back door that they use everyday, walk-in shower, shower chair, comfort height toilet, and SPC. At baseline, patient reports he's independent with all ADLs, IADLs, and he drives. Patient currently functioning at min guard for transfer sit<>stand using RW. Patient was able to ambulate ~25 feet in room, transferred to toilet with min guard/ supervision. Peri-care performed while standing with min guard. Patient was educated on the polar care system, LB dressing techniques and equipment if needed. Patient was able to verbalize and demonstrate understanding. Handout given. RW recommended. Patient left in recliner with call bell in reach, family present in room, and all needs met. No PT services recommended at this time. OT to complete order.      Recommendations for follow up therapy are one component of a multi-disciplinary discharge planning process, led by the attending physician.  Recommendations may be updated based on patient status, additional functional criteria and  insurance authorization.   Follow Up Recommendations  No OT follow up    Assistance Recommended at Discharge Frequent or constant Supervision/Assistance  Patient can return home with the following A little help with walking and/or transfers;A little help with bathing/dressing/bathroom;Help with stairs or ramp for entrance;Assist for transportation;Assistance with cooking/housework    Functional Status Assessment  Patient has had a recent decline in their functional status and demonstrates the ability to make significant improvements in function in a reasonable and predictable amount of time.  Equipment Recommendations  Other (comment) (RW)       Precautions / Restrictions Precautions Precautions: Knee;Fall Precaution Booklet Issued: Yes (comment) Restrictions Weight Bearing Restrictions: Yes LLE Weight Bearing: Weight bearing as tolerated      Mobility Bed Mobility               General bed mobility comments: Patient in recliner upon arrival.    Transfers Overall transfer level: Needs assistance Equipment used: Rolling walker (2 wheels) Transfers: Sit to/from Stand Sit to Stand: Min guard                  Balance Overall balance assessment: Needs assistance Sitting-balance support: Feet supported Sitting balance-Leahy Scale: Good     Standing balance support: Bilateral upper extremity supported Standing balance-Leahy Scale: Fair                             ADL either performed or assessed with clinical judgement   ADL Overall ADL's : Needs assistance/impaired  Toilet Transfer: Product/process development scientist and Hygiene: Min guard               Vision Baseline Vision/History: 1 Wears glasses Patient Visual Report: No change from baseline              Pertinent Vitals/Pain Pain Assessment Pain Assessment: No/denies pain     Hand Dominance Right    Extremity/Trunk Assessment Upper Extremity Assessment Upper Extremity Assessment: Overall WFL for tasks assessed   Lower Extremity Assessment Lower Extremity Assessment: Generalized weakness;LLE deficits/detail       Communication Communication Communication: No difficulties   Cognition Arousal/Alertness: Awake/alert Behavior During Therapy: WFL for tasks assessed/performed Overall Cognitive Status: Within Functional Limits for tasks assessed                                                  Home Living Family/patient expects to be discharged to:: Private residence Living Arrangements: Spouse/significant other Available Help at Discharge: Available 24 hours/day Type of Home: House Home Access: Stairs to enter (Stairs at back door entrance) Technical brewer of Steps: 2 at back entrance Entrance Stairs-Rails: None Home Layout: Two level;Able to live on main level with bedroom/bathroom     Bathroom Shower/Tub: Occupational psychologist: Handicapped height     Home Equipment: Shower seat;Cane - single point          Prior Functioning/Environment Prior Level of Function : Independent/Modified Independent;Driving             Mobility Comments: Independent ADLs Comments: Independent with ADLs and IADLs                OT Goals(Current goals can be found in the care plan section) Acute Rehab OT Goals Patient Stated Goal: return home OT Goal Formulation: With patient Time For Goal Achievement: 06/10/22 Potential to Achieve Goals: Good   AM-PAC OT "6 Clicks" Daily Activity     Outcome Measure Help from another person eating meals?: None Help from another person taking care of personal grooming?: A Little Help from another person toileting, which includes using toliet, bedpan, or urinal?: A Little Help from another person bathing (including washing, rinsing, drying)?: A Little Help from another person to put on and taking off  regular upper body clothing?: None Help from another person to put on and taking off regular lower body clothing?: A Little 6 Click Score: 20   End of Session Equipment Utilized During Treatment: Rolling walker (2 wheels) Nurse Communication: Mobility status  Activity Tolerance: Patient tolerated treatment well Patient left: in chair;with call bell/phone within reach;with family/visitor present                   Time: 5400-8676 OT Time Calculation (min): 19 min Charges:  OT General Charges $OT Visit: 1 Visit OT Evaluation $OT Eval Low Complexity: 1 Low    Josceline Chenard, OTS 06/10/2022, 10:33 AM

## 2022-06-10 NOTE — Progress Notes (Signed)
  Subjective: 1 Day Post-Op Procedure(s) (LRB): COMPUTER ASSISTED TOTAL KNEE ARTHROPLASTY (Left) Patient reports pain as mild.   Patient is well, and has had no acute complaints or problems Plan is to go Home after hospital stay. Negative for chest pain and shortness of breath Fever: no Gastrointestinal: negative for nausea and vomiting.   Patient has not had a bowel movement.  Objective: Vital signs in last 24 hours: Temp:  [97.1 F (36.2 C)-97.8 F (36.6 C)] 97.8 F (36.6 C) (11/02 0735) Pulse Rate:  [50-54] 51 (11/02 0735) Resp:  [13-17] 17 (11/02 0735) BP: (129-157)/(66-78) 139/66 (11/02 0735) SpO2:  [94 %-99 %] 99 % (11/02 0735)  Intake/Output from previous day:  Intake/Output Summary (Last 24 hours) at 06/10/2022 1154 Last data filed at 06/10/2022 0347 Gross per 24 hour  Intake 2926.64 ml  Output 1700 ml  Net 1226.64 ml    Intake/Output this shift: No intake/output data recorded.  Labs: No results for input(s): "HGB" in the last 72 hours. No results for input(s): "WBC", "RBC", "HCT", "PLT" in the last 72 hours. No results for input(s): "NA", "K", "CL", "CO2", "BUN", "CREATININE", "GLUCOSE", "CALCIUM" in the last 72 hours. No results for input(s): "LABPT", "INR" in the last 72 hours.   EXAM General - Patient is Alert, Appropriate, and Oriented Extremity - Neurovascular intact Dorsiflexion/Plantar flexion intact Compartment soft Dressing/Incision -Postoperative dressing remains in place., Polar Care in place and working. , Hemovac in place. , Following removal of post-op dressing, no significant drainage  Motor Function - intact, moving foot and toes well on exam. Able to perform independent SLR.  Cardiovascular-  bradycardic rate, regular rhythm Respiratory- Lungs clear to auscultation bilaterally Gastrointestinal- soft, nontender, and active bowel sounds   Assessment/Plan: 1 Day Post-Op Procedure(s) (LRB): COMPUTER ASSISTED TOTAL KNEE ARTHROPLASTY  (Left) Principal Problem:   Total knee replacement status  Estimated body mass index is 30.84 kg/m as calculated from the following:   Height as of this encounter: '5\' 10"'$  (1.778 m).   Weight as of this encounter: 97.5 kg. Advance diet Up with therapy  Discharge anticipated this PM pending completion of therapy goals.   Post-op dressing removed. , Hemovac removed., and Mini compression dressing applied.   DVT Prophylaxis - Lovenox, Ted hose, and SCDs Weight-Bearing as tolerated to left leg  Cassell Smiles, PA-C [facility] 06/10/2022, 11:54 AM

## 2022-06-10 NOTE — Progress Notes (Signed)
Physical Therapy Treatment Patient Details Name: Manuel Patel MRN: [id number] DOB: [birth year] Today's Date: 06/10/2022   History of Present Illness 71 y.o. male s/p left TKA. PMH includes HTN, left eye blindness, right glaucoma, prostate cancer, and N/T in left arm    PT Comments    Patient alert, agreeable to PT. Denied pain but did report "soreness" in L knee. The patient performed bed mobility modI, sit <> stand with RW and ambulation with supervision. He was able to ambulate ~22f with RW, improved cadence and step length, minimal antalgic gait noted. Returned to bed and HEP administered, all questions answered as able. The patient would benefit from further skilled PT intervention to continue to progress towards goals. Recommendation remains appropriate.       Recommendations for follow up therapy are one component of a multi-disciplinary discharge planning process, led by the attending physician.  Recommendations may be updated based on patient status, additional functional criteria and insurance authorization.  Follow Up Recommendations  Follow physician's recommendations for discharge plan and follow up therapies     Assistance Recommended at Discharge Set up Supervision/Assistance  Patient can return home with the following A little help with walking and/or transfers;A little help with bathing/dressing/bathroom;Help with stairs or ramp for entrance;Assist for transportation;Assistance with cooking/housework   Equipment Recommendations  Rolling walker (2 wheels)    Recommendations for Other Services       Precautions / Restrictions Precautions Precautions: Knee;Fall Precaution Booklet Issued: Yes (comment) Restrictions Weight Bearing Restrictions: Yes LLE Weight Bearing: Weight bearing as tolerated     Mobility  Bed Mobility Overal bed mobility: Modified Independent                  Transfers Overall transfer level: Needs assistance Equipment used:  Rolling walker (2 wheels) Transfers: Sit to/from Stand Sit to Stand: Supervision                Ambulation/Gait Ambulation/Gait assistance: Supervision Gait Distance (Feet): 200 Feet Assistive device: Rolling walker (2 wheels) Gait Pattern/deviations: Step-to pattern, Step-through pattern, Decreased step length - right, Decreased step length - left, Decreased stance time - right, Decreased stance time - left, Decreased stride length, Decreased dorsiflexion - right, Decreased dorsiflexion - left           Stairs             Wheelchair Mobility    Modified Rankin (Stroke Patients Only)       Balance Overall balance assessment: Needs assistance Sitting-balance support: Feet supported Sitting balance-Leahy Scale: Good     Standing balance support: Bilateral upper extremity supported Standing balance-Leahy Scale: Good                              Cognition Arousal/Alertness: Awake/alert Behavior During Therapy: WFL for tasks assessed/performed Overall Cognitive Status: Within Functional Limits for tasks assessed                                          Exercises Total Joint Exercises Goniometric ROM: approx 100 degrees of flexion and lacking 5 degrees of extension    General Comments        Pertinent Vitals/Pain Pain Assessment Pain Assessment: No/denies pain (but did report soreness of L knee)    Home Living Family/patient expects to be discharged to:: Private residence Living Arrangements:  Spouse/significant other Available Help at Discharge: Available 24 hours/day Type of Home: House Home Access: Stairs to enter Entrance Stairs-Rails: None Entrance Stairs-Number of Steps: 3   Home Layout: Two level;Able to live on main level with bedroom/bathroom Home Equipment: Shower seat;Cane - single point      Prior Function            PT Goals (current goals can now be found in the care plan section) Acute Rehab PT  Goals Patient Stated Goal: to return home PT Goal Formulation: With patient/family Time For Goal Achievement: 06/24/22 Potential to Achieve Goals: Good Progress towards PT goals: Progressing toward goals    Frequency    BID      PT Plan Current plan remains appropriate    Co-evaluation              AM-PAC PT "6 Clicks" Mobility   Outcome Measure  Help needed turning from your back to your side while in a flat bed without using bedrails?: None Help needed moving from lying on your back to sitting on the side of a flat bed without using bedrails?: None Help needed moving to and from a bed to a chair (including a wheelchair)?: None Help needed standing up from a chair using your arms (e.g., wheelchair or bedside chair)?: None Help needed to walk in hospital room?: None Help needed climbing 3-5 steps with a railing? : A Little 6 Click Score: 23    End of Session Equipment Utilized During Treatment: Gait belt Activity Tolerance: Patient tolerated treatment well Patient left: in chair;with call bell/phone within reach Nurse Communication: Mobility status PT Visit Diagnosis: Unsteadiness on feet (R26.81);Other abnormalities of gait and mobility (R26.89);Muscle weakness (generalized) (M62.81)     Time: 3403-5248 PT Time Calculation (min) (ACUTE ONLY): 11 min  Charges:  $Therapeutic Activity: 8-22 mins                     Lieutenant Diego PT, DPT 3:28 PM,06/10/22

## 2022-06-10 NOTE — Evaluation (Addendum)
Physical Therapy Evaluation Patient Details Name: Manuel Patel MRN: [id number] DOB: 01-31-1951 Today's Date: 06/10/2022  History of Present Illness  71 y.o. male s/p left TKA. PMH includes HTN, left eye blindness, right glaucoma, prostate cancer, and N/T in left arm  Clinical Impression  Pt found in chair upon PT entry. Sit<>Stand CGA and RW. Pt educated on RW safety/mechanics. Pt ambulated 571f with CGA and RW. Pt ascended/descended 4 steps backwards with min assist for RW. Pt demonstrated and verbalized understanding of PT therapeutic activity sequencing. Pt would benefit from skilled physical therapy to address listed deficits (see below) to increase independence with ADLs and function. Current recommendation is home and to follow physician's recommendations for follow up rehab services to return pt to PLOF.       Recommendations for follow up therapy are one component of a multi-disciplinary discharge planning process, led by the attending physician.  Recommendations may be updated based on patient status, additional functional criteria and insurance authorization.  Follow Up Recommendations Follow physician's recommendations for discharge plan and follow up therapies      Assistance Recommended at Discharge Set up Supervision/Assistance  Patient can return home with the following  A little help with walking and/or transfers;A little help with bathing/dressing/bathroom;Help with stairs or ramp for entrance;Assist for transportation;Assistance with cooking/housework    Equipment Recommendations Rolling walker (2 wheels)  Recommendations for Other Services       Functional Status Assessment Patient has had a recent decline in their functional status and demonstrates the ability to make significant improvements in function in a reasonable and predictable amount of time.     Precautions / Restrictions Precautions Precautions: Knee;Fall Precaution Booklet Issued: No Required Braces  or Orthoses: Knee Immobilizer - Left Restrictions Weight Bearing Restrictions: Yes LLE Weight Bearing: Weight bearing as tolerated      Mobility  Bed Mobility               General bed mobility comments: Patient in recliner upon arrival.    Transfers Overall transfer level: Needs assistance Equipment used: Rolling walker (2 wheels) Transfers: Sit to/from Stand Sit to Stand: Min guard                Ambulation/Gait Ambulation/Gait assistance: Min guard Gait Distance (Feet): 500 Feet Assistive device: Rolling walker (2 wheels) Gait Pattern/deviations: Step-to pattern, Step-through pattern, Decreased step length - right, Decreased step length - left, Decreased stance time - right, Decreased stance time - left, Decreased stride length, Decreased dorsiflexion - right, Decreased dorsiflexion - left          Stairs Stairs: Yes Stairs assistance: Min guard Stair Management: No rails, Backwards, With walker Number of Stairs: 4    Wheelchair Mobility    Modified Rankin (Stroke Patients Only)       Balance Overall balance assessment: Needs assistance Sitting-balance support: Feet supported Sitting balance-Leahy Scale: Good     Standing balance support: Bilateral upper extremity supported Standing balance-Leahy Scale: Good                               Pertinent Vitals/Pain Pain Assessment Pain Assessment: No/denies pain    Home Living Family/patient expects to be discharged to:: Private residence Living Arrangements: Spouse/significant other Available Help at Discharge: Available 24 hours/day Type of Home: House Home Access: Stairs to enter Entrance Stairs-Rails: None Entrance Stairs-Number of Steps: 3   Home Layout: Two level;Able to live on main level  with bedroom/bathroom Home Equipment: Shower seat;Cane - single point      Prior Function Prior Level of Function : Independent/Modified Independent;Driving              Mobility Comments: Independent ADLs Comments: Independent with ADLs and IADLs     Hand Dominance   Dominant Hand: Right    Extremity/Trunk Assessment   Upper Extremity Assessment Upper Extremity Assessment: Overall WFL for tasks assessed    Lower Extremity Assessment Lower Extremity Assessment: Generalized weakness    Cervical / Trunk Assessment Cervical / Trunk Assessment: Normal  Communication   Communication: No difficulties  Cognition Arousal/Alertness: Awake/alert Behavior During Therapy: WFL for tasks assessed/performed Overall Cognitive Status: Within Functional Limits for tasks assessed                                          General Comments      Exercises     Assessment/Plan    PT Assessment Patient needs continued PT services  PT Problem List Decreased strength;Decreased balance;Decreased knowledge of precautions;Decreased range of motion;Decreased knowledge of use of DME;Decreased activity tolerance       PT Treatment Interventions DME instruction;Functional mobility training;Balance training;Patient/family education;Gait training;Therapeutic activities;Neuromuscular re-education;Stair training;Therapeutic exercise    PT Goals (Current goals can be found in the Care Plan section)  Acute Rehab PT Goals Patient Stated Goal: to return home PT Goal Formulation: With patient/family Time For Goal Achievement: 06/24/22 Potential to Achieve Goals: Good    Frequency BID     Co-evaluation               AM-PAC PT "6 Clicks" Mobility  Outcome Measure Help needed turning from your back to your side while in a flat bed without using bedrails?: None Help needed moving from lying on your back to sitting on the side of a flat bed without using bedrails?: None Help needed moving to and from a bed to a chair (including a wheelchair)?: None Help needed standing up from a chair using your arms (e.g., wheelchair or bedside chair)?:  None Help needed to walk in hospital room?: None Help needed climbing 3-5 steps with a railing? : A Little 6 Click Score: 23    End of Session Equipment Utilized During Treatment: Gait belt Activity Tolerance: Patient tolerated treatment well Patient left: in chair;with chair alarm set;with call bell/phone within reach;with family/visitor present Nurse Communication: Mobility status PT Visit Diagnosis: Unsteadiness on feet (R26.81);Other abnormalities of gait and mobility (R26.89);Muscle weakness (generalized) (M62.81)    Time: 3419-3790 PT Time Calculation (min) (ACUTE ONLY): 24 min   Charges:   PT Evaluation $PT Eval Low Complexity: 1 Low PT Treatments $Therapeutic Activity: 8-22 mins        [employer], SPT 06/10/2022, 1:23 PM

## 2022-06-10 NOTE — Progress Notes (Signed)
Spoke with the patient  He needs a rolling walker and does not have a preference for DME companies, [employer] to deliver to the bedside He was set up with [facility] for [facility] services prior to surgery by the surgeons office His wife will provide transportation

## 2022-06-10 NOTE — Anesthesia Postprocedure Evaluation (Signed)
Anesthesia Post Note  Patient: Manuel Patel  Procedure(s) Performed: COMPUTER ASSISTED TOTAL KNEE ARTHROPLASTY (Left: Knee)  Patient location during evaluation: Nursing Unit Anesthesia Type: Spinal Level of consciousness: oriented and awake and alert Pain management: pain level controlled Vital Signs Assessment: post-procedure vital signs reviewed and stable Respiratory status: spontaneous breathing and respiratory function stable Cardiovascular status: blood pressure returned to baseline and stable Postop Assessment: no headache, no backache, no apparent nausea or vomiting and patient able to bend at knees Anesthetic complications: no   No notable events documented.   Last Vitals:  Vitals:   06/10/22 0455 06/10/22 0735  BP: (!) 141/70 139/66  Pulse: (!) 50 (!) 51  Resp: 17 17  Temp: 36.5 C 36.6 C  SpO2: 98% 99%    Last Pain:  Vitals:   06/10/22 0748  TempSrc:   PainSc: 0-No pain                 Breawna Montenegro Lorenza Chick

## 2022-06-10 NOTE — Plan of Care (Signed)

## 2022-06-10 NOTE — Discharge Summary (Signed)
Physician Discharge Summary  Patient ID: Manuel Patel MRN: [id number] DOB/AGE: 69/05/1951 69 y.o.  Admit date: 06/09/2022 Discharge date: 06/10/2022  Admission Diagnoses:  Total knee replacement status [Z96.659]  Surgeries:Procedure(s):  Left total knee arthroplasty using computer-assisted navigation   SURGEON:  Manuel Patel. M.D.   ASSISTANT: Manuel Smiles, PA-C (present and scrubbed throughout the case, critical for assistance with exposure, retraction, instrumentation, and closure)   ANESTHESIA: spinal   ESTIMATED BLOOD LOSS: 50 mL   FLUIDS REPLACED: 1500 mL of crystalloid   TOURNIQUET TIME: 93 minutes   DRAINS: 2 medium Hemovac drains   SOFT TISSUE RELEASES: Anterior cruciate ligament, posterior cruciate ligament, deep medial collateral ligament, patellofemoral ligament   IMPLANTS UTILIZED: DePuy Attune size 4 posterior stabilized femoral component (cemented), size 5 rotating platform tibial component (cemented), 38 mm medialized dome patella (cemented), and a 5 mm stabilized rotating platform polyethylene insert.  Discharge Diagnoses: Patient Active Problem List   Diagnosis Date Noted   Total knee replacement status 06/09/2022   Primary osteoarthritis of left knee 04/18/2022   Numbness of fingers of both hands 01/02/2019   Polyarthralgia 01/02/2019   ESR raised 12/21/2018   Inflammatory arthritis 12/21/2018   Prostate cancer ([place]) 06/19/2018   Encounter for screening colonoscopy 02/22/2018   Bladder outlet obstruction 08/18/2017   Prostate cancer ([place]) 08/18/2017   Chronic prostatitis 11/05/2016   Incomplete emptying of bladder 11/05/2016   Retention of urine, unspecified 11/05/2016   Laryngopharyngeal reflux (LPR) 10/22/2016   Pharyngoesophageal dysphagia 10/22/2016   Radiculopathy 03/17/2016   Superior glenoid labrum lesion of left shoulder 12/31/2015   Tendonitis of left rotator cuff 12/31/2015    Past Medical History:  Diagnosis Date   Blind left  eye    Bronchitis    Bronchitis    Cancer ([place]) 2019   prostate   Complication of anesthesia    severe constipation after ankle surgery and had to come to ed   Glaucoma, right eye    History of bronchitis    History of kidney stones    Hypertension    Numbness and tingling in left arm      Transfusion:    Consultants (if any):   Discharged Condition: Improved  Hospital Course: Manuel Patel is an 71 y.o. male who was admitted 06/09/2022 with a diagnosis of left knee osteoarthritis and went to the operating room on 06/09/2022 and underwent left total knee arthoplasty. The patient received perioperative antibiotics for prophylaxis (see below). The patient tolerated the procedure well and was transported to PACU in stable condition. After meeting PACU criteria, the patient was subsequently transferred to the Orthopaedics/Rehabilitation unit.   The patient received DVT prophylaxis in the form of early mobilization, Lovenox, TED hose, and SCDs . A sacral pad had been placed and heels were elevated off of the bed with rolled towels in order to protect skin integrity. Foley catheter was discontinued on postoperative day #0. Wound drains were discontinued on postoperative day #1. The surgical incision was healing well without signs of infection.  Physical therapy was initiated postoperatively for transfers, gait training, and strengthening. Occupational therapy was initiated for activities of daily living and evaluation for assisted devices. Rehabilitation goals were reviewed in detail with the patient. The patient made steady progress with physical therapy and physical therapy recommended discharge to Home.   The patient achieved the preliminary goals of this hospitalization and was felt to be medically and orthopaedically appropriate for discharge.  He was given perioperative antibiotics:  Anti-infectives (From admission, onward)    Start     Dose/Rate Route Frequency Ordered Stop   06/09/22  1800  ceFAZolin (ANCEF) IVPB 2g/100 mL premix        2 g 200 mL/hr over 30 Minutes Intravenous Every 6 hours 06/09/22 1707 06/10/22 0044   06/09/22 1039  ceFAZolin (ANCEF) 2-4 GM/100ML-% IVPB       Note to Pharmacy: Josephina Shih A: cabinet override      06/09/22 1039 06/10/22 0044   06/09/22 0600  ceFAZolin (ANCEF) IVPB 2g/100 mL premix        2 g 200 mL/hr over 30 Minutes Intravenous On call to O.R. 06/09/22 0013 06/09/22 1236     .  Recent vital signs:  Vitals:   06/10/22 0455 06/10/22 0735  BP: (!) 141/70 139/66  Pulse: (!) 50 (!) 51  Resp: 17 17  Temp: 97.7 F (36.5 C) 97.8 F (36.6 C)  SpO2: 98% 99%    Recent laboratory studies:  No results for input(s): "WBC", "HGB", "HCT", "PLT", "K", "CL", "CO2", "BUN", "CREATININE", "GLUCOSE", "CALCIUM", "LABPT", "INR" in the last 72 hours.  Diagnostic Studies: DG Knee Left Port  Result Date: 06/09/2022 CLINICAL DATA:  Left total knee replacement. EXAM: PORTABLE LEFT KNEE - 1-2 VIEW COMPARISON:  None Available. FINDINGS: The left femoral and tibial components are well situated. Expected postoperative changes are seen involving soft tissues anteriorly, including surgical drain seen anterior to distal femur. IMPRESSION: Status post left total knee arthroplasty. Electronically Signed   By: Marijo Conception M.D.   On: 06/09/2022 16:22    Discharge Medications:   Allergies as of 06/10/2022       Reactions   Penicillin V Rash   IgE = 296 (WNL) on 05/28/2022 Did it involve swelling of the face/tongue/throat, SOB, or low BP? No Did it involve sudden or severe rash/hives, skin peeling, or any reaction on the inside of your mouth or nose? No Did you need to seek medical attention at a hospital or doctor's office? Unknown When did it last happen?      childhood allergy  If all above answers are "NO", may proceed with cephalosporin use.   Pineapple Nausea Only        Medication List     STOP taking these medications    ibuprofen 200  MG tablet Commonly known as: ADVIL       TAKE these medications    atenolol 50 MG tablet Commonly known as: TENORMIN Take 50 mg by mouth 2 (two) times daily.   Biotin 1000 MCG tablet Take 1,000 mcg by mouth daily.   celecoxib 200 MG capsule Commonly known as: CELEBREX Take 1 capsule (200 mg total) by mouth 2 (two) times daily.   enoxaparin 40 MG/0.4ML injection Commonly known as: LOVENOX Inject 0.4 mLs (40 mg total) into the skin daily for 14 days.   fluticasone 50 MCG/ACT nasal spray Commonly known as: FLONASE Place 1 spray into both nostrils daily as needed for allergies or rhinitis.   hydrochlorothiazide 12.5 MG capsule Commonly known as: MICROZIDE Take 12.5 mg by mouth every morning.   multivitamin tablet Take 1 tablet by mouth 3 (three) times daily.   olmesartan 40 MG tablet Commonly known as: BENICAR Take 40 mg by mouth 2 (two) times daily.   oxyCODONE 5 MG immediate release tablet Commonly known as: Oxy IR/ROXICODONE Take 1 tablet (5 mg total) by mouth every 4 (four) hours as needed for moderate pain (pain score 4-6).  tamsulosin 0.4 MG Caps capsule Commonly known as: FLOMAX TAKE 1 CAPSULE BY MOUTH ONCE DAILY What changed:  how to take this when to take this   timolol 0.5 % ophthalmic gel-forming Commonly known as: TIMOPTIC-XR Place 1 drop into both eyes at bedtime.   traMADol 50 MG tablet Commonly known as: ULTRAM Take 1 tablet (50 mg total) by mouth every 4 (four) hours as needed for moderate pain.               Durable Medical Equipment  (From admission, onward)           Start     Ordered   06/10/22 1509  For home use only DME Walker rolling  Once       Question Answer Comment  Walker: With [place] Wheels   Patient needs a walker to treat with the following condition Weakness generalized      06/10/22 1508   06/09/22 1708  DME Walker rolling  Once       Question:  Patient needs a walker to treat with the following condition   Answer:  Total knee replacement status   06/09/22 1707   06/09/22 1708  DME Bedside commode  Once       Question:  Patient needs a bedside commode to treat with the following condition  Answer:  Total knee replacement status   06/09/22 1707            Disposition: Home with outpatient PT     Follow-up Information     Fausto Skillern, PA-C Follow up on 06/24/2022.   Specialty: Orthopedic Surgery Why: at 9:45am Contact information: [place] [address] [phone]         Dereck Leep, MD Follow up on 07/22/2022.   Specialty: Orthopedic Surgery Why: at 2:45pm Contact information: [place] [address] [phone]         Particia Jasper, [place]. Go to.   Specialty: Physical Therapy Why: 06/15/22 @ 1:00 Particia Jasper, PT Evaluation Contact information: [place] [address] [place], PA-C 06/10/2022, 3:32 PM

## 2022-06-22 NOTE — Progress Notes (Unsigned)
Virtual Visit via Telephone Note  I connected with Manuel Patel on 06/22/22 at  2:00 PM EST by telephone and verified that I am speaking with the correct person using two identifiers.  Location: Patient: Home Provider: Office   I discussed the limitations, risks, security and privacy concerns of performing an evaluation and management service by telephone and the availability of in person appointments. I also discussed with the patient that there may be a patient responsible charge related to this service. The patient expressed understanding and agreed to proceed.   History of Present Illness: Urological history: 1. 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 LU TS -tamsulosin 0.4 mg daily    Observations/Objective: He is answering questions appropriately and does not sound distressed.  He is only experiencing nocturia x1.  Patient denies any modifying or aggravating factors.  Patient denies any gross hematuria, dysuria or suprapubic/flank pain.  Patient denies any fevers, chills, nausea or vomiting.    Assessment and Plan: 1. Prostate cancer -PSA at nadir -has follow-up in March with Dr. Donella Stade for PSA and office visit  2. LU TS -At goal -tamsulosin 0.4 mg daily  Follow Up Instructions:  Follow-up in 1 year.   I discussed the assessment and treatment plan with the patient. The patient was provided an opportunity to ask questions and all were answered. The patient agreed with the plan and demonstrated an understanding of the instructions.   The patient was advised to  call back or seek an in-person evaluation if the symptoms worsen or if the condition fails to improve as anticipated.  I provided 5 minutes of non-face-to-face time during this encounter.  Shamia Uppal, PA-C

## 2022-06-23 ENCOUNTER — Encounter: Payer: Self-pay | Admitting: Urology

## 2022-06-23 ENCOUNTER — Ambulatory Visit (INDEPENDENT_AMBULATORY_CARE_PROVIDER_SITE_OTHER): Payer: Medicare Other | Admitting: Urology

## 2022-06-23 DIAGNOSIS — R350 Frequency of micturition: Secondary | ICD-10-CM

## 2022-06-23 DIAGNOSIS — C61 Malignant neoplasm of prostate: Secondary | ICD-10-CM | POA: Diagnosis not present

## 2022-07-22 ENCOUNTER — Other Ambulatory Visit: Payer: Self-pay | Admitting: Urology

## 2022-07-22 DIAGNOSIS — C61 Malignant neoplasm of prostate: Secondary | ICD-10-CM

## 2022-10-28 ENCOUNTER — Inpatient Hospital Stay: Payer: Medicare Other | Attending: Radiation Oncology

## 2022-10-28 DIAGNOSIS — C61 Malignant neoplasm of prostate: Secondary | ICD-10-CM | POA: Diagnosis not present

## 2022-10-28 LAB — PSA: Prostatic Specific Antigen: 0.16 ng/mL (ref 0.00–4.00)

## 2022-10-30 ENCOUNTER — Other Ambulatory Visit: Payer: Self-pay | Admitting: Urology

## 2022-10-30 DIAGNOSIS — C61 Malignant neoplasm of prostate: Secondary | ICD-10-CM

## 2022-11-04 ENCOUNTER — Ambulatory Visit: Payer: Federal, State, Local not specified - PPO | Admitting: Radiation Oncology

## 2022-11-11 ENCOUNTER — Ambulatory Visit
Admission: RE | Admit: 2022-11-11 | Discharge: 2022-11-11 | Disposition: A | Discharge status: Home / Self Care | Payer: Medicare Other | Source: Ambulatory Visit | Attending: Radiation Oncology | Admitting: Radiation Oncology

## 2022-11-11 ENCOUNTER — Encounter: Payer: Self-pay | Admitting: Radiation Oncology

## 2022-11-11 ENCOUNTER — Ambulatory Visit: Payer: Medicare Other | Admitting: Radiation Oncology

## 2022-11-11 VITALS — BP 139/67 | HR 52 | Temp 97.5°F | Resp 16 | Ht 70.0 in | Wt 221.5 lb

## 2022-11-11 DIAGNOSIS — C61 Malignant neoplasm of prostate: Secondary | ICD-10-CM

## 2022-11-11 DIAGNOSIS — Z923 Personal history of irradiation: Secondary | ICD-10-CM | POA: Diagnosis not present

## 2022-11-11 NOTE — Progress Notes (Signed)
Radiation Oncology Follow up Note  Name: Manuel Patel   Date:   11/11/2022 MRN:  [id number] DOB: [birth year]    This 72 y.o. male presents to the clinic today for 3-1/2-year follow-up status post I-125 interstitial implant for Gleason 6 adenocarcinoma presenting with a PSA of 6.  REFERRING PROVIDER: Albina Billet, MD  HPI: Patient is a 72 year old male now out 3-1/2 years having completed I-125 interstitial implant for Gleason 6 adenocarcinoma.Marland Kitchen  He is seen today in routine follow-up doing well specifically denies any increased lower urinary tract symptoms diarrhea or fatigue.  His most recent PSA is 0.16 up from 0.08 last year although 2 years ago was also 0.16.  COMPLICATIONS OF TREATMENT: none  FOLLOW UP COMPLIANCE: keeps appointments   PHYSICAL EXAM:  BP 139/67   Pulse (!) 52   Temp (!) 97.5 F (36.4 C)   Resp 16   Ht 5\' 10"  (1.778 m)   Wt 221 lb 8 oz (100.5 kg)   BMI 31.78 kg/m  Well-developed well-nourished patient in NAD. HEENT reveals PERLA, EOMI, discs not visualized.  Oral cavity is clear. No oral mucosal lesions are identified. Neck is clear without evidence of cervical or supraclavicular adenopathy. Lungs are clear to A&P. Cardiac examination is essentially unremarkable with regular rate and rhythm without murmur rub or thrill. Abdomen is benign with no organomegaly or masses noted. Motor sensory and DTR levels are equal and symmetric in the upper and lower extremities. Cranial nerves II through XII are grossly intact. Proprioception is intact. No peripheral adenopathy or edema is identified. No motor or sensory levels are noted. Crude visual fields are within normal range.  RADIOLOGY RESULTS: No films for review  PLAN: At the present time patient is doing well under excellent biochemical control of his prostate cancer.  And pleased with his overall progress.  I have asked to see him back in 1 year for follow-up with a repeat PSA.  I will discontinue follow-up care after  that.  Patient is to call with any concerns.  I would like to take this opportunity to thank you for allowing me to participate in the care of your patient.Noreene Filbert, MD

## 2023-02-16 ENCOUNTER — Other Ambulatory Visit: Payer: Self-pay | Admitting: Urology

## 2023-02-16 DIAGNOSIS — C61 Malignant neoplasm of prostate: Secondary | ICD-10-CM

## 2023-06-17 ENCOUNTER — Other Ambulatory Visit: Payer: Self-pay

## 2023-06-17 DIAGNOSIS — C61 Malignant neoplasm of prostate: Secondary | ICD-10-CM

## 2023-06-20 ENCOUNTER — Other Ambulatory Visit: Payer: Medicare Other

## 2023-06-20 DIAGNOSIS — C61 Malignant neoplasm of prostate: Secondary | ICD-10-CM

## 2023-06-21 ENCOUNTER — Other Ambulatory Visit: Payer: Federal, State, Local not specified - PPO

## 2023-06-21 LAB — PSA: Prostate Specific Ag, Serum: 0.1 ng/mL (ref 0.0–4.0)

## 2023-06-21 LAB — LAB REPORT - SCANNED
EGFR (Non-African Amer.): 92
TSH: 1.75 (ref 0.41–5.90)

## 2023-06-24 ENCOUNTER — Ambulatory Visit: Payer: Federal, State, Local not specified - PPO | Admitting: Urology

## 2023-07-11 NOTE — Progress Notes (Unsigned)
07/13/2023 9:52 AM   Manuel Patel 1951/03/07 [id number]  Referring provider: Jaclyn Shaggy, MD [address]   [place],  [place] [address]  Urological history: 1. Prostate cancer -PSA (06/2023) 0.1 -cT1c low risk adenocarcinoma prostate -Biopsy December 2018 PSA 4.2; volume 31 cc -2/12 cores Gleason 3+3 adenocarcinoma left mid/left apex(10 and 30% of submitted tissue) -Elected active surveillance -PSA 01/2018 4.6 -MRI 01/2018; PI-RADS 4 lesion left anterior apex -Fusion biopsy 03/2018; ROI 2 cores positive Gleason 3+3 adenocarcinoma 10% each otherwise negative -s/p I-125 interstitial implant on 06/19/2019   2. BPH with LU TS -tamsulosin 0.4 mg daily   Chief Complaint  Patient presents with   Prostate Cancer   HPI: Manuel Patel is a 72 y.o. male who presents today for one year follow up.   Previous records reviewed.   I PSS 4/1  PVR 0 mL  He has no complain today.  Patient denies any modifying or aggravating factors.  Patient denies any recent UTI's, gross hematuria, dysuria or suprapubic/flank pain.  Patient denies any fevers, chills, nausea or vomiting.     IPSS     Row Name 07/13/23 0900         International Prostate Symptom Score   How often have you had the sensation of not emptying your bladder? Less than 1 in 5     How often have you had to urinate less than every two hours? Less than 1 in 5 times     How often have you found you stopped and started again several times when you urinated? Not at All     How often have you found it difficult to postpone urination? Not at All     How often have you had a weak urinary stream? Less than 1 in 5 times     How often have you had to strain to start urination? Not at All     How many times did you typically get up at night to urinate? 1 Time     Total IPSS Score 4       Quality of Life due to urinary symptoms   If you were to spend the rest of your life with your urinary condition just the way it is now  how would you feel about that? Pleased              Score:  1-7 Mild 8-19 Moderate 20-35 Severe    SHIM 25   Patient still having spontaneous erections.  He denies any pain or curvature with erections.     SHIM     Row Name 07/13/23 0949         SHIM: Over the last 6 months:   How do you rate your confidence that you could get and keep an erection? Very High     When you had erections with sexual stimulation, how often were your erections hard enough for penetration (entering your partner)? Almost Always or Always     During sexual intercourse, how often were you able to maintain your erection after you had penetrated (entered) your partner? Almost Always or Always     During sexual intercourse, how difficult was it to maintain your erection to completion of intercourse? Not Difficult     When you attempted sexual intercourse, how often was it satisfactory for you? Almost Always or Always       SHIM Total Score   SHIM 25  Score: 1-7 Severe ED 8-11 Moderate ED 12-16 Mild-Moderate ED 17-21 Mild ED 22-25 No ED   PMH: Past Medical History:  Diagnosis Date   Blind left eye    Bronchitis    Bronchitis    Cancer (HCC) 2019   prostate   Complication of anesthesia    severe constipation after ankle surgery and had to come to ed   Glaucoma, right eye    History of bronchitis    History of kidney stones    Hypertension    Numbness and tingling in left arm     Surgical History: Past Surgical History:  Procedure Laterality Date   ANKLE SURGERY Right    ANTERIOR CERVICAL DECOMP/DISCECTOMY FUSION N/A 03/17/2016   Procedure: ANTERIOR CERVICAL DECOMPRESSION FUSION CERVICAL 3-4, CERVICAL 4-5, CERVICAL 5-6 WITH INSTRUMENTATION AND ALLOGRAFT;  Surgeon: Estill Bamberg, MD;  [facility];  Service: Orthopedics;  Laterality: N/A;  ANTERIOR CERVICAL DECOMPRESSION FUSION CERVICAL 3-4, CERVICAL 4-5, CERVICAL 5-6 WITH INSTRUMENTATION AND ALLOGRAFT    COLONOSCOPY     COLONOSCOPY WITH PROPOFOL N/A 03/29/2018   Procedure: COLONOSCOPY WITH PROPOFOL;  Surgeon: Earline Mayotte, MD;  Location: [facility];  Service: Endoscopy;  Laterality: N/A;   CYSTOSCOPY N/A 06/19/2019   Procedure: CYSTOSCOPY;  Surgeon: Riki Altes, MD;  Location: [facility] ORS;  Service: Urology;  Laterality: N/A;   EYE SURGERY Left    prosthetic left eye   EYE SURGERY Right    cataract   KNEE ARTHROPLASTY Left 06/09/2022   Procedure: COMPUTER ASSISTED TOTAL KNEE ARTHROPLASTY;  Surgeon: Donato Heinz, MD;  Location: [facility] ORS;  Service: Orthopedics;  Laterality: Left;   RADIOACTIVE SEED IMPLANT N/A 06/19/2019   Procedure: RADIOACTIVE SEED IMPLANT/BRACHYTHERAPY IMPLANT;  Surgeon: Riki Altes, MD;  Location: [facility];  Service: Urology;  Laterality: N/A;    Home Medications:  Allergies as of 07/13/2023       Reactions   Penicillin V Rash   IgE = 296 (WNL) on 05/28/2022 Did it involve swelling of the face/tongue/throat, SOB, or low BP? No Did it involve sudden or severe rash/hives, skin peeling, or any reaction on the inside of your mouth or nose? No Did you need to seek medical attention at a hospital or doctor's office? Unknown When did it last happen?      childhood allergy  If all above answers are "NO", may proceed with cephalosporin use.   Pineapple Nausea Only        Medication List        Accurate as of July 13, 2023  9:52 AM. If you have any questions, ask your nurse or doctor.          atenolol 50 MG tablet Commonly known as: TENORMIN Take 50 mg by mouth 2 (two) times daily.   Biotin 1000 MCG tablet Take 1,000 mcg by mouth daily.   celecoxib 200 MG capsule Commonly known as: CELEBREX Take 1 capsule (200 mg total) by mouth 2 (two) times daily.   enoxaparin 40 MG/0.4ML injection Commonly known as: LOVENOX Inject 0.4 mLs (40 mg total) into the skin daily for 14 days.   fluticasone 50 MCG/ACT nasal spray Commonly known as:  FLONASE Place 1 spray into both nostrils daily as needed for allergies or rhinitis.   hydrochlorothiazide 12.5 MG capsule Commonly known as: MICROZIDE Take 12.5 mg by mouth every morning.   multivitamin tablet Take 1 tablet by mouth 3 (three) times daily.   olmesartan 40 MG tablet Commonly known as: [employer]  Take 40 mg by mouth 2 (two) times daily.   oxyCODONE 5 MG immediate release tablet Commonly known as: Oxy IR/ROXICODONE Take 1 tablet (5 mg total) by mouth every 4 (four) hours as needed for moderate pain (pain score 4-6).   tamsulosin 0.4 MG Caps capsule Commonly known as: FLOMAX TAKE 1 CAPSULE BY MOUTH ONCE DAILY   timolol 0.5 % ophthalmic gel-forming Commonly known as: TIMOPTIC-XR Place 1 drop into both eyes at bedtime.   traMADol 50 MG tablet Commonly known as: ULTRAM Take 1 tablet (50 mg total) by mouth every 4 (four) hours as needed for moderate pain.        Allergies:  Allergies  Allergen Reactions   Penicillin V Rash    IgE = 296 (WNL) on 05/28/2022  Did it involve swelling of the face/tongue/throat, SOB, or low BP? No Did it involve sudden or severe rash/hives, skin peeling, or any reaction on the inside of your mouth or nose? No Did you need to seek medical attention at a hospital or doctor's office? Unknown When did it last happen?      childhood allergy  If all above answers are "NO", may proceed with cephalosporin use.    Pineapple Nausea Only    Family History: No family history on file.  Social History:  reports that he has never smoked. He has never used smokeless tobacco. He reports that he does not drink alcohol and does not use drugs.  ROS: Pertinent ROS in HPI  Physical Exam: BP (!) 166/82   Pulse (!) 47   Ht 5\' 10"  (1.778 m)   Wt 221 lb (100.2 kg)   BMI 31.71 kg/m   Constitutional:  Well nourished. Alert and oriented, No acute distress. HEENT: [place] AT, moist mucus membranes.  Trachea midline Cardiovascular: No clubbing, cyanosis,  or edema. Respiratory: Normal respiratory effort, no increased work of breathing. Neurologic: Grossly intact, no focal deficits, moving all 4 extremities. Psychiatric: Normal mood and affect.  Laboratory Data: Component     Latest Ref Rng 06/20/2023  Prostate Specific Ag, Serum     0.0 - 4.0 ng/mL 0.1   I have reviewed the labs.   Pertinent Imaging:  07/13/23 09:32  Scan Result 0     Assessment & Plan:    1. Prostate cancer -PSA demonstrating good biochemical control -Has follow-up with Dr. Aggie Cosier in April -We will see him in 1 year for repeat PSA  2. BPH with LUTS -PSA stable  -PVR < 300 cc  -continue conservative management, avoiding bladder irritants and timed voiding's -Continue tamsulosin 0.4 mg daily  3. Erectile dysfunction - no issues    Return in about 1 year (around 07/12/2024) for PSA, I PSS .  These notes generated with voice recognition software. I apologize for typographical errors.  Cloretta Ned  [facility] [address]  [address] [place], [place] [address] [phone]

## 2023-07-13 ENCOUNTER — Encounter: Payer: Self-pay | Admitting: Urology

## 2023-07-13 ENCOUNTER — Ambulatory Visit: Payer: Medicare Other | Admitting: Urology

## 2023-07-13 VITALS — BP 166/82 | HR 47 | Ht 70.0 in | Wt 221.0 lb

## 2023-07-13 DIAGNOSIS — N401 Enlarged prostate with lower urinary tract symptoms: Secondary | ICD-10-CM | POA: Diagnosis not present

## 2023-07-13 DIAGNOSIS — N529 Male erectile dysfunction, unspecified: Secondary | ICD-10-CM

## 2023-07-13 DIAGNOSIS — C61 Malignant neoplasm of prostate: Secondary | ICD-10-CM

## 2023-07-13 DIAGNOSIS — N138 Other obstructive and reflux uropathy: Secondary | ICD-10-CM

## 2023-07-13 LAB — BLADDER SCAN AMB NON-IMAGING: Scan Result: 0

## 2023-08-08 ENCOUNTER — Other Ambulatory Visit: Payer: Self-pay | Admitting: Physician Assistant

## 2023-08-08 DIAGNOSIS — C61 Malignant neoplasm of prostate: Secondary | ICD-10-CM

## 2023-10-20 ENCOUNTER — Other Ambulatory Visit
Admission: RE | Admit: 2023-10-20 | Discharge: 2023-10-20 | Disposition: A | Discharge status: Home / Self Care | Source: Ambulatory Visit | Attending: Student | Admitting: Student

## 2023-10-20 DIAGNOSIS — R0602 Shortness of breath: Secondary | ICD-10-CM | POA: Diagnosis present

## 2023-10-20 DIAGNOSIS — R0789 Other chest pain: Secondary | ICD-10-CM | POA: Diagnosis present

## 2023-10-20 DIAGNOSIS — Z03818 Encounter for observation for suspected exposure to other biological agents ruled out: Secondary | ICD-10-CM | POA: Diagnosis present

## 2023-10-20 LAB — BRAIN NATRIURETIC PEPTIDE: B Natriuretic Peptide: 261.2 pg/mL — ABNORMAL HIGH (ref 0.0–100.0)

## 2023-10-20 LAB — TROPONIN I (HIGH SENSITIVITY): Troponin I (High Sensitivity): 7 ng/L (ref <18)

## 2023-10-26 ENCOUNTER — Other Ambulatory Visit: Payer: Self-pay | Admitting: Cardiology

## 2023-10-26 DIAGNOSIS — R079 Chest pain, unspecified: Secondary | ICD-10-CM

## 2023-10-26 DIAGNOSIS — I209 Angina pectoris, unspecified: Secondary | ICD-10-CM

## 2023-10-26 DIAGNOSIS — R0609 Other forms of dyspnea: Secondary | ICD-10-CM

## 2023-11-02 ENCOUNTER — Telehealth (HOSPITAL_COMMUNITY): Payer: Self-pay | Admitting: *Deleted

## 2023-11-02 ENCOUNTER — Encounter (HOSPITAL_COMMUNITY): Payer: Self-pay

## 2023-11-02 NOTE — Telephone Encounter (Signed)
Reaching out to patient to offer assistance regarding upcoming cardiac imaging study; pt verbalizes understanding of appt date/time, parking situation and where to check in, pre-test NPO status, and verified current allergies; name and call back number provided for further questions should they arise  Jahking Lesser RN Navigator Cardiac Imaging [facility] [phone] office [phone] cell  Patient to take his daily medications. 

## 2023-11-03 ENCOUNTER — Ambulatory Visit
Admission: RE | Admit: 2023-11-03 | Discharge: 2023-11-03 | Disposition: A | Discharge status: Home / Self Care | Source: Ambulatory Visit | Attending: Cardiology | Admitting: Cardiology

## 2023-11-03 DIAGNOSIS — R079 Chest pain, unspecified: Secondary | ICD-10-CM | POA: Insufficient documentation

## 2023-11-03 DIAGNOSIS — I209 Angina pectoris, unspecified: Secondary | ICD-10-CM | POA: Insufficient documentation

## 2023-11-03 DIAGNOSIS — R0609 Other forms of dyspnea: Secondary | ICD-10-CM | POA: Insufficient documentation

## 2023-11-03 MED ORDER — NITROGLYCERIN 0.4 MG SL SUBL
0.8000 mg | SUBLINGUAL_TABLET | Freq: Once | SUBLINGUAL | Status: AC
  Administered 2023-11-03: 0.8 mg via SUBLINGUAL

## 2023-11-03 MED ORDER — IOHEXOL 350 MG/ML SOLN
100.0000 mL | Freq: Once | INTRAVENOUS | Status: AC | PRN
  Administered 2023-11-03: 100 mL via INTRAVENOUS

## 2023-11-03 MED ORDER — SODIUM CHLORIDE 0.9 % IV SOLN: INTRAVENOUS | Status: DC

## 2023-11-03 NOTE — Progress Notes (Signed)
 Patient tolerated procedure well. Ambulate w/o difficulty. Denies light headedness or being dizzy. Sitting up drinking water provided. Encouraged to drink extra water today and reasoning explained. Verbalized understanding. All questions answered. ABC intact. No further needs. Discharge from procedure area w/o issues.

## 2023-11-10 ENCOUNTER — Inpatient Hospital Stay: Payer: Medicare Other | Attending: Radiation Oncology

## 2023-11-10 DIAGNOSIS — C61 Malignant neoplasm of prostate: Secondary | ICD-10-CM | POA: Diagnosis present

## 2023-11-11 LAB — PSA: Prostatic Specific Antigen: 0.2 ng/mL (ref 0.00–4.00)

## 2023-11-17 ENCOUNTER — Ambulatory Visit
Admission: RE | Admit: 2023-11-17 | Discharge: 2023-11-17 | Disposition: A | Discharge status: Home / Self Care | Payer: Medicare Other | Source: Ambulatory Visit | Attending: Radiation Oncology | Admitting: Radiation Oncology

## 2023-11-17 ENCOUNTER — Encounter: Payer: Self-pay | Admitting: Radiation Oncology

## 2023-11-17 VITALS — BP 80/44 | HR 56 | Temp 97.3°F | Resp 20 | Wt 221.0 lb

## 2023-11-17 DIAGNOSIS — C61 Malignant neoplasm of prostate: Secondary | ICD-10-CM | POA: Diagnosis present

## 2023-11-17 DIAGNOSIS — Z923 Personal history of irradiation: Secondary | ICD-10-CM | POA: Insufficient documentation

## 2023-11-17 NOTE — Progress Notes (Signed)
 Radiation Oncology Follow up Note  Name: Manuel Patel   Date:   11/17/2023 MRN:  [id number] DOB: 1951-05-01    This 73 y.o. male presents to the clinic today for close to 5-year follow-up status post I-125 interstitial implant for Gleason 6 adenocarcinoma presenting with a PSA of 6.  REFERRING PROVIDER: Jaclyn Shaggy, MD  HPI: Patient is a 73 year old male now out close to 5 years having completed I-125 interstitial implant for Gleason 6 adenocarcinoma.  Seen today in routine follow-up he is doing well very low side effect profile specifically denies any nocturia urgency frequency or diarrhea.  His most recent PSA is 0.2.  Which has been fairly stable over the past 4 years.  COMPLICATIONS OF TREATMENT: none  FOLLOW UP COMPLIANCE: keeps appointments   PHYSICAL EXAM:  BP (!) 80/44   Pulse (!) 56   Temp (!) 97.3 F (36.3 C)   Resp 20   Wt 221 lb (100.2 kg)   BMI 31.71 kg/m  Well-developed well-nourished patient in NAD. HEENT reveals PERLA, EOMI, discs not visualized.  Oral cavity is clear. No oral mucosal lesions are identified. Neck is clear without evidence of cervical or supraclavicular adenopathy. Lungs are clear to A&P. Cardiac examination is essentially unremarkable with regular rate and rhythm without murmur rub or thrill. Abdomen is benign with no organomegaly or masses noted. Motor sensory and DTR levels are equal and symmetric in the upper and lower extremities. Cranial nerves II through XII are grossly intact. Proprioception is intact. No peripheral adenopathy or edema is identified. No motor or sensory levels are noted. Crude visual fields are within normal range.  RADIOLOGY RESULTS: No current films for review  PLAN: Present time patient continues under excellent biochemical control of his prostate cancer.  At this time close to 5 years out I will turn follow-up care over to urology.  Would be happy to reevaluate him anytime should that be indicated.  Patient knows to call  with any concerns.  I would like to take this opportunity to thank you for allowing me to participate in the care of your patient.Carmina Miller, MD

## 2023-12-15 ENCOUNTER — Telehealth: Payer: Self-pay | Admitting: Internal Medicine

## 2023-12-15 NOTE — Telephone Encounter (Signed)
 LVM to schedule nw pt appt..OK for E2C2 to schedule appt.

## 2024-01-30 ENCOUNTER — Encounter: Payer: Self-pay | Admitting: Family Medicine

## 2024-01-30 ENCOUNTER — Ambulatory Visit (INDEPENDENT_AMBULATORY_CARE_PROVIDER_SITE_OTHER): Admitting: Family Medicine

## 2024-01-30 VITALS — BP 124/62 | HR 47 | Ht 70.0 in | Wt 219.0 lb

## 2024-01-30 DIAGNOSIS — I5032 Chronic diastolic (congestive) heart failure: Secondary | ICD-10-CM | POA: Insufficient documentation

## 2024-01-30 DIAGNOSIS — C61 Malignant neoplasm of prostate: Secondary | ICD-10-CM | POA: Diagnosis not present

## 2024-01-30 DIAGNOSIS — I10 Essential (primary) hypertension: Secondary | ICD-10-CM | POA: Diagnosis not present

## 2024-01-30 MED ORDER — ATENOLOL 50 MG PO TABS: 50.0000 mg | ORAL_TABLET | Freq: Two times a day (BID) | ORAL | 1 refills | Status: DC

## 2024-01-30 MED ORDER — OLMESARTAN MEDOXOMIL 40 MG PO TABS: 40.0000 mg | ORAL_TABLET | Freq: Two times a day (BID) | ORAL | 1 refills | Status: AC

## 2024-01-30 NOTE — Assessment & Plan Note (Signed)
 Diagnosed by cardiologist. Heart pumps effectively but does not relax fully, causing pulmonary and peripheral edema. Spironolactone discontinued due to hypotension and dizziness. - Continue furosemide 20 mg daily - Discontinue spironolactone as per cardiologist's advice - Monitor blood pressure regularly - Check potassium levels due to discontinuation of spironolactone

## 2024-01-30 NOTE — Assessment & Plan Note (Signed)
 Fluctuating blood pressure with recent hypotensive episodes likely from medication adjustments. Current readings mostly in the 110s-120s range, which is acceptable. - Continue atenolol  50 mg twice daily - Continue olmesartan 40 mg twice daily - Monitor blood pressure regularly - Refill prescriptions at [facility]

## 2024-01-30 NOTE — Progress Notes (Signed)
 New patient visit   Patient: Manuel Patel   DOB: [birth year]   73 y.o. Male  MRN: [id number] Visit Date: 01/30/2024  Today's healthcare provider: Jon Eva, MD   Chief Complaint  Patient presents with   Establish Care    Discuss Bp    Subjective    Manuel Patel is a 73 y.o. male who presents today as a new patient to establish care.   Discussed the use of AI scribe software for clinical note transcription with the patient, who gave verbal consent to proceed.  History of Present Illness   Manuel Patel is a 73 year old male with hypertension who presents with fluctuating blood pressure.  He experiences fluctuating blood pressure, with readings reaching 190/90 mmHg. During a visit to a walk-in clinic in March, he had chest tightness. A cardiologist found a 25% blockage in his heart. He was prescribed furosemide and spironolactone for ankle swelling but developed dizziness and hypotension (80/50 mmHg) after starting these medications. Discontinuing spironolactone improved his symptoms, though occasional hypotension persists.  He has a history of prostate cancer treated with radiation therapy and seed implants. He takes tamsulosin  daily for urination without current issues.  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 He uses nightly eye drops for glaucoma.  He has undergone left knee replacement, right ankle surgery, and cervical spine surgery with fusion and decompression. He had a severe infection post-prostate seed implantation, treated with antibiotics.  He has allergies to penicillin, Celebrex , hydrochlorothiazide , and pineapple.         Past Medical History:  Diagnosis Date   Blind left eye    Bronchitis    Bronchitis    Cancer (HCC) 2019   prostate   Complication of anesthesia    severe constipation after ankle surgery and had to come to ed   Glaucoma, right eye    History of bronchitis    History of  kidney stones    Hypertension    Numbness and tingling in left arm    Past Surgical History:  Procedure Laterality Date   ANKLE SURGERY Right    ANTERIOR CERVICAL DECOMP/DISCECTOMY FUSION N/A 03/17/2016   Procedure: ANTERIOR CERVICAL DECOMPRESSION FUSION CERVICAL 3-4, CERVICAL 4-5, CERVICAL 5-6 WITH INSTRUMENTATION AND ALLOGRAFT;  Surgeon: Oneil Priestly, MD;  Location: [facility] OR;  Service: Orthopedics;  Laterality: N/A;  ANTERIOR CERVICAL DECOMPRESSION FUSION CERVICAL 3-4, CERVICAL 4-5, CERVICAL 5-6 WITH INSTRUMENTATION AND ALLOGRAFT   COLONOSCOPY     COLONOSCOPY WITH PROPOFOL  N/A 03/29/2018   Procedure: COLONOSCOPY WITH PROPOFOL ;  Surgeon: Dessa Reyes ORN, MD;  Location: [facility];  Service: Endoscopy;  Laterality: N/A;   CYSTOSCOPY N/A 06/19/2019   Procedure: CYSTOSCOPY;  Surgeon: Twylla Glendia BROCKS, MD;  Location: [facility] ORS;  Service: Urology;  Laterality: N/A;   EYE SURGERY Left    prosthetic left eye   EYE SURGERY Right    cataract   KNEE ARTHROPLASTY Left 06/09/2022   Procedure: COMPUTER ASSISTED TOTAL KNEE ARTHROPLASTY;  Surgeon: Mardee Lynwood SQUIBB, MD;  Location: [facility] ORS;  Service: Orthopedics;  Laterality: Left;   RADIOACTIVE SEED IMPLANT N/A 06/19/2019   Procedure: RADIOACTIVE SEED IMPLANT/BRACHYTHERAPY IMPLANT;  Surgeon: Twylla Glendia BROCKS, MD;  Location: [facility] ORS;  Service: Urology;  Laterality: N/A;   Family Status  Relation Name Status   Mother  Deceased   Father  Deceased   PGF  (Not Specified)  No partnership data on file  Family History  Problem Relation Age of Onset   Dementia Mother    Cancer Father    Dementia Paternal Grandfather    Social History   Socioeconomic History   Marital status: Married    Spouse name: Not on file   Number of children: Not on file   Years of education: Not on file   Highest education level: Not on file  Occupational History   Not on file  Tobacco Use   Smoking status: Never   Smokeless tobacco: Never  Vaping Use   Vaping  status: Never Used  Substance and Sexual Activity   Alcohol use: No   Drug use: No   Sexual activity: Yes  Other Topics Concern   Not on file  Social History Narrative   Not on file   Social Drivers of Health   Financial Resource Strain: Low Risk  (01/30/2024)   Overall Financial Resource Strain (CARDIA)    Difficulty of Paying Living Expenses: Not hard at all  Food Insecurity: No Food Insecurity (01/30/2024)   Hunger Vital Sign    Worried About Running Out of Food in the Last Year: Never true    Ran Out of Food in the Last Year: Never true  Transportation Needs: No Transportation Needs (01/30/2024)   PRAPARE - Administrator, Civil Service (Medical): No    Lack of Transportation (Non-Medical): No  Physical Activity: Not on file  Stress: No Stress Concern Present (01/30/2024)   [employer] of Occupational Health - Occupational Stress Questionnaire    Feeling of Stress: Only a little  Social Connections: Not on file   Outpatient Medications Prior to Visit  Medication Sig   Multiple Vitamin (MULTIVITAMIN) tablet Take 1 tablet by mouth 3 (three) times daily.   tamsulosin  (FLOMAX ) 0.4 MG CAPS capsule TAKE 1 CAPSULE BY MOUTH ONCE DAILY   timolol  (TIMOPTIC -XR) 0.5 % ophthalmic gel-forming Place 1 drop into both eyes at bedtime.   [DISCONTINUED] atenolol  (TENORMIN ) 50 MG tablet Take 50 mg by mouth 2 (two) times daily.   [DISCONTINUED] olmesartan (BENICAR) 40 MG tablet Take 40 mg by mouth 2 (two) times daily.   fluticasone  (FLONASE ) 50 MCG/ACT nasal spray Place 1 spray into both nostrils daily as needed for allergies or rhinitis.   furosemide (LASIX) 20 MG tablet Take 20 mg by mouth daily. (Patient not taking: Take 20 mg by mouth daily.)   oxyCODONE  (OXY IR/ROXICODONE ) 5 MG immediate release tablet Take 1 tablet (5 mg total) by mouth every 4 (four) hours as needed for moderate pain (pain score 4-6).   [DISCONTINUED] Biotin 1000 MCG tablet Take 1,000 mcg by mouth daily.  (Patient not taking: Reported on 01/30/2024)   [DISCONTINUED] celecoxib  (CELEBREX ) 200 MG capsule Take 1 capsule (200 mg total) by mouth 2 (two) times daily. (Patient not taking: Reported on 01/30/2024)   [DISCONTINUED] hydrochlorothiazide  (MICROZIDE ) 12.5 MG capsule Take 12.5 mg by mouth every morning. (Patient not taking: Reported on 01/30/2024)   [DISCONTINUED] spironolactone (ALDACTONE) 25 MG tablet Take 25 mg by mouth daily. (Patient not taking: Reported on 01/30/2024)   [DISCONTINUED] traMADol  (ULTRAM ) 50 MG tablet Take 1 tablet (50 mg total) by mouth every 4 (four) hours as needed for moderate pain. (Patient not taking: Reported on 01/30/2024)   No facility-administered medications prior to visit.   Allergies  Allergen Reactions   Celecoxib  Hives   Hydrochlorothiazide     Penicillin V Rash    IgE = 296 (WNL) on 05/28/2022  Did it involve  swelling of the face/tongue/throat, SOB, or low BP? No Did it involve sudden or severe rash/hives, skin peeling, or any reaction on the inside of your mouth or nose? No Did you need to seek medical attention at a hospital or doctor's office? Unknown When did it last happen?      childhood allergy  If all above answers are NO, may proceed with cephalosporin use.    Pineapple Nausea Only    Review of Systems      Objective    BP 124/62   Pulse (!) 47   Ht 5' 10 (1.778 m)   Wt 219 lb (99.3 kg)   SpO2 98%   BMI 31.42 kg/m     Physical Exam Vitals reviewed.  Constitutional:      General: He is not in acute distress.    Appearance: Normal appearance. He is not diaphoretic.  HENT:     Head: Normocephalic and atraumatic.   Eyes:     General: No scleral icterus.    Conjunctiva/sclera: Conjunctivae normal.    Cardiovascular:     Rate and Rhythm: Normal rate and regular rhythm.     Heart sounds: Normal heart sounds. No murmur heard. Pulmonary:     Effort: Pulmonary effort is normal. No respiratory distress.     Breath sounds:  Normal breath sounds. No wheezing or rhonchi.   Musculoskeletal:     Cervical back: Neck supple.     Right lower leg: No edema.     Left lower leg: No edema.  Lymphadenopathy:     Cervical: No cervical adenopathy.   Skin:    General: Skin is warm and dry.     Findings: No rash.   Neurological:     Mental Status: He is alert and oriented to person, place, and time. Mental status is at baseline.   Psychiatric:        Mood and Affect: Mood normal.        Behavior: Behavior normal.     Depression Screen    01/30/2024    3:05 PM  PHQ 2/9 Scores  PHQ - 2 Score 0  PHQ- 9 Score 1   No results found for any visits on 01/30/24.  Assessment & Plan      Problem List Items Addressed This Visit       Cardiovascular and Mediastinum   Chronic heart failure with preserved ejection fraction (HCC) - Primary   Diagnosed by cardiologist. Heart pumps effectively but does not relax fully, causing pulmonary and peripheral edema. Spironolactone discontinued due to hypotension and dizziness. - Continue furosemide 20 mg daily - Discontinue spironolactone as per cardiologist's advice - Monitor blood pressure regularly - Check potassium levels due to discontinuation of spironolactone      Relevant Medications   furosemide (LASIX) 20 MG tablet   olmesartan (BENICAR) 40 MG tablet   atenolol  (TENORMIN ) 50 MG tablet   Essential hypertension   Fluctuating blood pressure with recent hypotensive episodes likely from medication adjustments. Current readings mostly in the 110s-120s range, which is acceptable. - Continue atenolol  50 mg twice daily - Continue olmesartan 40 mg twice daily - Monitor blood pressure regularly - Refill prescriptions at [facility]      Relevant Medications   furosemide (LASIX) 20 MG tablet   olmesartan (BENICAR) 40 MG tablet   atenolol  (TENORMIN ) 50 MG tablet   Other Relevant Orders   Basic Metabolic Panel (BMET)     Genitourinary   Prostate cancer (HCC)  Low-grade prostate cancer treated with brachytherapy. Under surveillance with urologist. PSA levels previously monitored closely due to gradual increase. - Continue follow-up with urologist Dr. Jacinta - Plan for PSA check in November at [facility] - Schedule physical exam and complete blood work in November           Glaucoma of right eye Managed with nightly eye drops. Regular follow-up with ophthalmologist.  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  Cervical spine surgery Cervical spine surgery with fusion and decompression. Recovery was successful with good arm mobility.      Return in about 5 months (around 07/01/2024) for AWV, With PCP.     I personally spent a total of 55 minutes in the care of the patient today including preparing to see the patient, getting/reviewing separately obtained history, performing a medically appropriate exam/evaluation, counseling and educating, placing orders, documenting clinical information in the EHR, independently interpreting results, communicating results, and coordinating care.   Jon Eva, MD  [facility] [phone] (phone) [phone] (fax)  [facility]

## 2024-01-30 NOTE — Assessment & Plan Note (Signed)
 Low-grade prostate cancer treated with brachytherapy. Under surveillance with urologist. PSA levels previously monitored closely due to gradual increase. - Continue follow-up with urologist Dr. Jacinta - Plan for PSA check in November at [facility] - Schedule physical exam and complete blood work in November

## 2024-01-31 ENCOUNTER — Ambulatory Visit: Payer: Self-pay | Admitting: Family Medicine

## 2024-01-31 LAB — BASIC METABOLIC PANEL WITH GFR
BUN/Creatinine Ratio: 17 (ref 10–24)
BUN: 16 mg/dL (ref 8–27)
CO2: 20 mmol/L (ref 20–29)
Calcium: 10.1 mg/dL (ref 8.6–10.2)
Chloride: 100 mmol/L (ref 96–106)
Creatinine, Ser: 0.94 mg/dL (ref 0.76–1.27)
Glucose: 80 mg/dL (ref 70–99)
Potassium: 4.7 mmol/L (ref 3.5–5.2)
Sodium: 142 mmol/L (ref 134–144)
eGFR: 87 mL/min/{1.73_m2} (ref >59)

## 2024-02-15 ENCOUNTER — Other Ambulatory Visit: Payer: Self-pay | Admitting: Urology

## 2024-02-15 DIAGNOSIS — C61 Malignant neoplasm of prostate: Secondary | ICD-10-CM

## 2024-05-05 ENCOUNTER — Other Ambulatory Visit: Payer: Self-pay | Admitting: Family Medicine

## 2024-07-09 ENCOUNTER — Encounter: Payer: Self-pay | Admitting: Family Medicine

## 2024-07-09 ENCOUNTER — Ambulatory Visit: Admitting: Family Medicine

## 2024-07-09 VITALS — BP 158/90 | HR 44 | Ht 70.0 in | Wt 226.5 lb

## 2024-07-09 DIAGNOSIS — C61 Malignant neoplasm of prostate: Secondary | ICD-10-CM

## 2024-07-09 DIAGNOSIS — I10 Essential (primary) hypertension: Secondary | ICD-10-CM

## 2024-07-09 DIAGNOSIS — Z23 Encounter for immunization: Secondary | ICD-10-CM

## 2024-07-09 DIAGNOSIS — Z0000 Encounter for general adult medical examination without abnormal findings: Secondary | ICD-10-CM

## 2024-07-09 DIAGNOSIS — Z0001 Encounter for general adult medical examination with abnormal findings: Secondary | ICD-10-CM | POA: Diagnosis not present

## 2024-07-09 MED ORDER — AMLODIPINE BESYLATE 5 MG PO TABS: 5.0000 mg | ORAL_TABLET | Freq: Every day | ORAL | 1 refills | Status: DC

## 2024-07-09 NOTE — Progress Notes (Signed)
 Annual Wellness Visit   Patient: Manuel Patel, Male    DOB: 1950/10/26, 73 y.o.   MRN: [id number]  Chief Complaint  Patient presents with   Annual Exam    Subjective:    Manuel Patel is a 72 y.o. male who presents today for his Annual Wellness Visit.  HPI  Discussed the use of AI scribe software for clinical note transcription with the patient, who gave verbal consent to proceed.  History of Present Illness   Manuel Patel is a 73 year old male with hypertension who presents for a flu shot and blood pressure management.  He is here for his annual flu shot. He has had no serious vaccine reactions, no recent illness or fever, and no recent blood transfusions, seizures, Guillain-Barre, or pregnancy. He is allergic to penicillin and pineapple, with no vaccine allergies.  He has prostate cancer under urology care and is scheduled for a PSA test today. He requests that results be sent to [facility].  He recently had plantar fasciitis and received a cortisone injection to the foot a few weeks ago with improved pain.  He is concerned about blood pressure after a recent reading of 149/unknown at [facility]. He takes olmesartan  40 mg twice daily, Lasix 20 mg daily, and atenolol  50 mg twice daily. He has not yet taken his usual morning atenolol  dose, which he takes around 9 AM.  No recent vaccines other than the cortisone shot for plantar fasciitis.           Medications: Outpatient Medications Prior to Visit  Medication Sig   atenolol  (TENORMIN ) 50 MG tablet Take 1 tablet (50 mg total) by mouth 2 (two) times daily.   fluticasone  (FLONASE ) 50 MCG/ACT nasal spray Place 1 spray into both nostrils daily as needed for allergies or rhinitis.   furosemide (LASIX) 20 MG tablet Take 20 mg by mouth daily.   Multiple Vitamin (MULTIVITAMIN) tablet Take 1 tablet by mouth 3 (three) times daily.   olmesartan  (BENICAR ) 40 MG tablet Take 1 tablet (40 mg total) by mouth 2  (two) times daily.   oxyCODONE  (OXY IR/ROXICODONE ) 5 MG immediate release tablet Take 1 tablet (5 mg total) by mouth every 4 (four) hours as needed for moderate pain (pain score 4-6).   tamsulosin  (FLOMAX ) 0.4 MG CAPS capsule TAKE 1 CAPSULE BY MOUTH ONCE DAILY   timolol  (TIMOPTIC -XR) 0.5 % ophthalmic gel-forming Place 1 drop into both eyes at bedtime.   No facility-administered medications prior to visit.    Allergies  Allergen Reactions   Celecoxib  Hives   Hydrochlorothiazide     Penicillin V Rash    IgE = 296 (WNL) on 05/28/2022  Did it involve swelling of the face/tongue/throat, SOB, or low BP? No Did it involve sudden or severe rash/hives, skin peeling, or any reaction on the inside of your mouth or nose? No Did you need to seek medical attention at a hospital or doctor's office? Unknown When did it last happen?      childhood allergy  If all above answers are NO, may proceed with cephalosporin use.    Pineapple Nausea Only    Patient Care Team: Myrla Jon HERO, MD as PCP - General (Family Medicine) Lenn Aran, MD as Consulting Physician (Radiation Oncology)  ROS   Objective:  Objective  BP (!) 158/90 (BP Location: Left Arm, Patient Position: Sitting, Cuff Size: Large)   Pulse (!) 44   Ht 5' 10 (1.778 m)   Wt 226  lb 8 oz (102.7 kg)   SpO2 99%   BMI 32.50 kg/m    Physical Exam Vitals reviewed.  Constitutional:      General: He is not in acute distress.    Appearance: Normal appearance. He is well-developed. He is not diaphoretic.  HENT:     Head: Normocephalic and atraumatic.     Right Ear: Tympanic membrane, ear canal and external ear normal.     Left Ear: Tympanic membrane, ear canal and external ear normal.     Nose: Nose normal.     Mouth/Throat:     Mouth: Mucous membranes are moist.     Pharynx: Oropharynx is clear. No oropharyngeal exudate.  Eyes:     General: No scleral icterus.    Conjunctiva/sclera: Conjunctivae normal.     Pupils:  Pupils are equal, round, and reactive to light.  Neck:     Thyroid: No thyromegaly.  Cardiovascular:     Rate and Rhythm: Normal rate and regular rhythm.     Heart sounds: Normal heart sounds. No murmur heard. Pulmonary:     Effort: Pulmonary effort is normal. No respiratory distress.     Breath sounds: Normal breath sounds. No wheezing or rales.  Abdominal:     General: There is no distension.     Palpations: Abdomen is soft.     Tenderness: There is no abdominal tenderness.  Musculoskeletal:        General: No deformity.     Cervical back: Neck supple.     Right lower leg: No edema.     Left lower leg: No edema.  Lymphadenopathy:     Cervical: No cervical adenopathy.  Skin:    General: Skin is warm and dry.     Findings: No rash.  Neurological:     Mental Status: He is alert and oriented to person, place, and time. Mental status is at baseline.     Gait: Gait normal.  Psychiatric:        Mood and Affect: Mood normal.        Behavior: Behavior normal.        Thought Content: Thought content normal.        Most recent depression screenings:    07/09/2024    8:50 AM 01/30/2024    3:05 PM  PHQ 2/9 Scores  PHQ - 2 Score 0 0  PHQ- 9 Score  1      Data saved with a previous flowsheet row definition    Visit info / Clinical Intake: Medicare Wellness Visit Type:: Subsequent Annual Wellness Visit Persons participating in visit:: patient Medicare Wellness Visit Mode:: In-person (required for WTM) Information given by:: patient Interpreter Needed?: No Pre-visit prep was completed: no AWV questionnaire completed by patient prior to visit?: no Living arrangements:: lives with spouse/significant other Patient's Overall Health Status Rating: good Typical amount of pain: some Does pain affect daily life?: no Are you currently prescribed opioids?: no  Dietary Habits and Nutritional Risks How many meals a day?: 2 Eats fruit and vegetables daily?: (!) no Most meals are  obtained by: eating out In the last 2 weeks, have you had any of the following?: none Diabetic:: no  Functional Status Activities of Daily Living (to include ambulation/medication): Independent Ambulation: Independent Medication Administration: Independent Home Management: Independent Manage your own finances?: yes Primary transportation is: driving Concerns about vision?: no *vision screening is required for WTM* Concerns about hearing?: no  Fall Screening Falls in the past year?: 0 Number of  falls in past year: 0 Was there an injury with Fall?: 0 Fall Risk Category Calculator: 0 Patient Fall Risk Level: Low Fall Risk  Fall Risk Patient at Risk for Falls Due to: No Fall Risks Fall risk Follow up: Falls evaluation completed  Home and Transportation Safety: All rugs have non-skid backing?: yes All stairs or steps have railings?: yes Grab bars in the bathtub or shower?: (!) no Have non-skid surface in bathtub or shower?: (!) no Good home lighting?: yes Regular seat belt use?: (!) no Hospital stays in the last year:: no  Cognitive Assessment Difficulty concentrating, remembering, or making decisions? : no Will 6CIT or Mini Cog be Completed: no 6CIT or Mini Cog Declined: patient alert, oriented, able to answer questions appropriately and recall recent events  Advance Directives (For Healthcare) Does Patient Have a Medical Advance Directive?: No Would patient like information on creating a medical advance directive?: Yes (Inpatient - patient defers creating a medical advance directive and declines information at this time)  Reviewed/Updated  Reviewed/Updated: Reviewed All (Medical, Surgical, Family, Medications, Allergies, Care Teams, Patient Goals)    Vision/Hearing Screen: No results found.   No results found for any visits on 07/09/24.   Assessment & Plan:    Annual wellness visit done today including the all of the following: Reviewed patient's Family and  Medical History Reviewed and updated list of patient's medical providers Assessment of cognitive impairment was done Assessed patient's functional ability Established a written schedule for health screening services Health Risk Assessment Completed and Reviewed  Exercise Activities and Dietary recommendations  Goals   None     Immunization History  Administered Date(s) Administered   Influenza,inj,Quad PF,6+ Mos 05/27/2017   Influenza-Unspecified 04/18/2022   PFIZER Comirnaty(Gray Top)Covid-19 Tri-Sucrose Vaccine 12/20/2020   PFIZER(Purple Top)SARS-COV-2 Vaccination 09/15/2019, 10/09/2019, 05/31/2020   Zoster Recombinant(Shingrix) 05/27/2017    Health Maintenance  Topic Date Due   Hepatitis C Screening  Never done   DTaP/Tdap/Td (1 - Tdap) Never done   Pneumococcal Vaccine: 50+ Years (1 of 2 - PCV) Never done   Zoster Vaccines- Shingrix (2 of 2) 07/22/2017   Influenza Vaccine  03/09/2024   COVID-19 Vaccine (5 - 2025-26 season) 04/09/2024   Medicare Annual Wellness (AWV)  07/09/2025   Colonoscopy  03/29/2028   Meningococcal B Vaccine  Aged Out    Discussed health benefits of physical activity, and encouraged him to engage in regular exercise appropriate for his age and condition.    Problem List Items Addressed This Visit       Cardiovascular and Mediastinum   Essential hypertension   Blood pressure remains elevated at 158/90 mmHg despite current regimen of olmesartan  40 mg twice daily, atenolol  50 mg twice daily, and furosemide 20 mg daily. Previous use of amlodipine  was discontinued due to concerns about contamination, not the medication itself. Current atenolol  dose cannot be increased due to low pulse rate. Amlodipine  is considered safe and effective for blood pressure control without affecting heart rate. - Started amlodipine  5 mg once daily. - Scheduled follow-up appointment in one month to reassess blood pressure. - Continue current medications: olmesartan ,  atenolol , and furosemide.      Relevant Medications   amLODipine  (NORVASC ) 5 MG tablet   Other Relevant Orders   Comprehensive metabolic panel with GFR   Lipid panel     Genitourinary   Prostate cancer (HCC)   No current issues related to prostate cancer. PSA levels were last checked in April. He is scheduled to see a  urologist in about a week and a half. - Ordered PSA test today and sent results to [facility]. - Continue routine monitoring of PSA levels.      Relevant Orders   PSA Total (Reflex To Free)   Other Visit Diagnoses       Encounter for annual wellness visit (AWV) in Medicare patient    -  Primary     Immunization due       Relevant Orders   Flu vaccine HIGH DOSE PF(Fluzone Trivalent)          General Health Maintenance Up to date on colonoscopy, next due in 2029. Flu shot administered today. Other vaccines (shingles, pneumonia, tetanus) need to be tracked down from previous records. - Administered flu shot today. - Will track down and update records for shingles, pneumonia, and tetanus vaccines.        Return in about 4 weeks (around 08/06/2024) for BP f/u.     Jon Eva, MD [facility]

## 2024-07-09 NOTE — Assessment & Plan Note (Signed)
 Blood pressure remains elevated at 158/90 mmHg despite current regimen of olmesartan  40 mg twice daily, atenolol  50 mg twice daily, and furosemide 20 mg daily. Previous use of amlodipine  was discontinued due to concerns about contamination, not the medication itself. Current atenolol  dose cannot be increased due to low pulse rate. Amlodipine  is considered safe and effective for blood pressure control without affecting heart rate. - Started amlodipine  5 mg once daily. - Scheduled follow-up appointment in one month to reassess blood pressure. - Continue current medications: olmesartan , atenolol , and furosemide.

## 2024-07-09 NOTE — Assessment & Plan Note (Signed)
 No current issues related to prostate cancer. PSA levels were last checked in April. He is scheduled to see a urologist in about a week and a half. - Ordered PSA test today and sent results to [facility]. - Continue routine monitoring of PSA levels.

## 2024-07-10 LAB — LIPID PANEL
Chol/HDL Ratio: 3 ratio (ref 0.0–5.0)
Cholesterol, Total: 179 mg/dL (ref 100–199)
HDL: 59 mg/dL (ref >39)
LDL Chol Calc (NIH): 108 mg/dL — ABNORMAL HIGH (ref 0–99)
Triglycerides: 61 mg/dL (ref 0–149)
VLDL Cholesterol Cal: 12 mg/dL (ref 5–40)

## 2024-07-10 LAB — COMPREHENSIVE METABOLIC PANEL WITH GFR
ALT: 15 IU/L (ref 0–44)
AST: 25 IU/L (ref 0–40)
Albumin: 4.5 g/dL (ref 3.8–4.8)
Alkaline Phosphatase: 99 IU/L (ref 47–123)
BUN/Creatinine Ratio: 17 (ref 10–24)
BUN: 16 mg/dL (ref 8–27)
Bilirubin Total: 1.4 mg/dL — ABNORMAL HIGH (ref 0.0–1.2)
CO2: 23 mmol/L (ref 20–29)
Calcium: 9.6 mg/dL (ref 8.6–10.2)
Chloride: 102 mmol/L (ref 96–106)
Creatinine, Ser: 0.93 mg/dL (ref 0.76–1.27)
Globulin, Total: 2.4 g/dL (ref 1.5–4.5)
Glucose: 82 mg/dL (ref 70–99)
Potassium: 4.5 mmol/L (ref 3.5–5.2)
Sodium: 140 mmol/L (ref 134–144)
Total Protein: 6.9 g/dL (ref 6.0–8.5)
eGFR: 88 mL/min/1.73 (ref >59)

## 2024-07-10 LAB — PSA TOTAL (REFLEX TO FREE): Prostate Specific Ag, Serum: 0.3 ng/mL (ref 0.0–4.0)

## 2024-07-11 ENCOUNTER — Ambulatory Visit: Payer: Self-pay

## 2024-07-19 ENCOUNTER — Other Ambulatory Visit: Payer: Self-pay

## 2024-07-22 NOTE — Progress Notes (Unsigned)
 07/26/2024 7:24 PM   Manuel Patel Dec 26, 1950 [id number]  Referring provider: Corlis Honor BROCKS, MD No address on file  Urological history: 1. Prostate cancer -PSA (06/2024) 0.3 -cT1c low risk adenocarcinoma prostate -Biopsy December 2018 PSA 4.2; volume 31 cc -2/12 cores Gleason 3+3 adenocarcinoma left mid/left apex(10 and 30% of submitted tissue) -Elected active surveillance -PSA 01/2018 4.6 -MRI 01/2018; PI-RADS 4 lesion left anterior apex -Fusion biopsy 03/2018; ROI 2 cores positive Gleason 3+3 adenocarcinoma 10% each otherwise negative -s/p I-125 interstitial implant on 06/19/2019   2. BPH with LU TS -tamsulosin  0.4 mg daily   No chief complaint on file.  HPI: Manuel Patel is a 73 y.o. male who presents today for one year follow up.   Previous records reviewed.   I PSS ***  He reports sensation of incomplete bladder emptying,   urinary frequency,   urinary intermittency,   urinary urgency,   a weak urinary stream,   having to strain to void,   nocturia x ***,   leaking before being able to reach the restroom,   leaking with coughing,   leaking without awareness,   and post void dribbling.     He is wearing *** pads//depends  daily.    Patient denies any modifying or aggravating factors.  Patient denies any recent UTI's, gross hematuria, dysuria or suprapubic/flank pain.  Patient denies any fevers, chills, nausea or vomiting.  ***  He has a family history of PCa, colon cancer, ovarian cancer and/or breast cancer with ***.   He does not have a family history of PCa, colon cancer, ovarian cancer, and/or breast cancer .***     PSA (07/2024)  0.3  Serum creatinine (07/2024) 0.93  Diuretics: Lasix and spironolactone   BPH meds: tamsulosin  0.4 mg daily   Fluid consumption: ***    PMH: Past Medical History:  Diagnosis Date   Blind left eye    Bronchitis    Bronchitis    Cancer (HCC) 2019   prostate   Complication of anesthesia    severe  constipation after ankle surgery and had to come to ed   Glaucoma, right eye    History of bronchitis    History of kidney stones    Hypertension    Numbness and tingling in left arm     Surgical History: Past Surgical History:  Procedure Laterality Date   ANKLE SURGERY Right    ANTERIOR CERVICAL DECOMP/DISCECTOMY FUSION N/A 03/17/2016   Procedure: ANTERIOR CERVICAL DECOMPRESSION FUSION CERVICAL 3-4, CERVICAL 4-5, CERVICAL 5-6 WITH INSTRUMENTATION AND ALLOGRAFT;  Surgeon: Oneil Priestly, MD;  Location: [facility] OR;  Service: Orthopedics;  Laterality: N/A;  ANTERIOR CERVICAL DECOMPRESSION FUSION CERVICAL 3-4, CERVICAL 4-5, CERVICAL 5-6 WITH INSTRUMENTATION AND ALLOGRAFT   COLONOSCOPY     COLONOSCOPY WITH PROPOFOL  N/A 03/29/2018   Procedure: COLONOSCOPY WITH PROPOFOL ;  Surgeon: Dessa Reyes ORN, MD;  Location: [facility];  Service: Endoscopy;  Laterality: N/A;   CYSTOSCOPY N/A 06/19/2019   Procedure: CYSTOSCOPY;  Surgeon: Twylla Glendia BROCKS, MD;  Location: [facility] ORS;  Service: Urology;  Laterality: N/A;   EYE SURGERY Left    prosthetic left eye   EYE SURGERY Right    cataract   KNEE ARTHROPLASTY Left 06/09/2022   Procedure: COMPUTER ASSISTED TOTAL KNEE ARTHROPLASTY;  Surgeon: Mardee Lynwood SQUIBB, MD;  Location: [facility] ORS;  Service: Orthopedics;  Laterality: Left;   RADIOACTIVE SEED IMPLANT N/A 06/19/2019   Procedure: RADIOACTIVE SEED IMPLANT/BRACHYTHERAPY IMPLANT;  Surgeon: Twylla Glendia BROCKS, MD;  Location: [facility] ORS;  Service: Urology;  Laterality: N/A;    Home Medications:  Allergies as of 07/26/2024       Reactions   Celecoxib  Hives   Hydrochlorothiazide     Penicillin V Rash   IgE = 296 (WNL) on 05/28/2022 Did it involve swelling of the face/tongue/throat, SOB, or low BP? No Did it involve sudden or severe rash/hives, skin peeling, or any reaction on the inside of your mouth or nose? No Did you need to seek medical attention at a hospital or doctor's office? Unknown When did it last  happen?      childhood allergy  If all above answers are NO, may proceed with cephalosporin use.   Pineapple Nausea Only        Medication List        Accurate as of July 22, 2024  7:24 PM. If you have any questions, ask your nurse or doctor.          amLODipine  5 MG tablet Commonly known as: NORVASC  Take 1 tablet (5 mg total) by mouth daily.   atenolol  50 MG tablet Commonly known as: TENORMIN  Take 1 tablet (50 mg total) by mouth 2 (two) times daily.   fluticasone  50 MCG/ACT nasal spray Commonly known as: FLONASE  Place 1 spray into both nostrils daily as needed for allergies or rhinitis.   furosemide 20 MG tablet Commonly known as: LASIX Take 20 mg by mouth daily.   multivitamin tablet Take 1 tablet by mouth 3 (three) times daily.   olmesartan  40 MG tablet Commonly known as: BENICAR  Take 1 tablet (40 mg total) by mouth 2 (two) times daily.   oxyCODONE  5 MG immediate release tablet Commonly known as: Oxy IR/ROXICODONE  Take 1 tablet (5 mg total) by mouth every 4 (four) hours as needed for moderate pain (pain score 4-6).   tamsulosin  0.4 MG Caps capsule Commonly known as: FLOMAX  TAKE 1 CAPSULE BY MOUTH ONCE DAILY   timolol  0.5 % ophthalmic gel-forming Commonly known as: TIMOPTIC -XR Place 1 drop into both eyes at bedtime.        Allergies:  Allergies  Allergen Reactions   Celecoxib  Hives   Hydrochlorothiazide     Penicillin V Rash    IgE = 296 (WNL) on 05/28/2022  Did it involve swelling of the face/tongue/throat, SOB, or low BP? No Did it involve sudden or severe rash/hives, skin peeling, or any reaction on the inside of your mouth or nose? No Did you need to seek medical attention at a hospital or doctor's office? Unknown When did it last happen?      childhood allergy  If all above answers are NO, may proceed with cephalosporin use.    Pineapple Nausea Only    Family History: Family History  Problem Relation Age of Onset   Dementia  Mother    Cancer Father    Dementia Paternal Grandfather     Social History:  reports that he has never smoked. He has never used smokeless tobacco. He reports that he does not drink alcohol and does not use drugs.  ROS: Pertinent ROS in HPI  Physical Exam: There were no vitals taken for this visit.  Constitutional:  Well nourished. Alert and oriented, No acute distress. HEENT: [place] AT, moist mucus membranes.  Trachea midline, no masses. Cardiovascular: No clubbing, cyanosis, or edema. Respiratory: Normal respiratory effort, no increased work of breathing. GI: Abdomen is soft, non tender, non distended, no abdominal masses. Liver and spleen not palpable.  No hernias appreciated.  Stool sample for occult testing is not indicated.   GU: No CVA tenderness.  No bladder fullness or masses.  Patient with circumcised/uncircumcised phallus. ***Foreskin easily retracted***  Urethral meatus is patent.  No penile discharge. No penile lesions or rashes. Scrotum without lesions, cysts, rashes and/or edema.  Testicles are located scrotally bilaterally. No masses are appreciated in the testicles. Left and right epididymis are normal. Rectal: Patient with  normal sphincter tone. Anus and perineum without scarring or rashes. No rectal masses are appreciated. Prostate is approximately *** grams, *** nodules are appreciated. Seminal vesicles are normal. Skin: No rashes, bruises or suspicious lesions. Lymph: No cervical or inguinal adenopathy. Neurologic: Grossly intact, no focal deficits, moving all 4 extremities. Psychiatric: Normal mood and affect.   Laboratory Data: See Epic and HPI   I have reviewed the labs.   Pertinent Imaging: N//A   Assessment & Plan:    1. Prostate cancer -PSA increased slightly  -repeat PSA in 6 months to ensure upwards trend continues   2. BPH with LUTS -continue conservative management, avoiding bladder irritants and timed voiding's -Continue tamsulosin  0.4 mg  daily  3. Erectile dysfunction - no issues    No follow-ups on file.  These notes generated with voice recognition software. I apologize for typographical errors.  CLOTILDA HELON RIGGERS  [facility] [address]  [address] [place], [place] [address] [phone]

## 2024-07-26 ENCOUNTER — Ambulatory Visit: Admission: EM | Admit: 2024-07-26 | Discharge: 2024-07-26 | Discharge status: Against Medical Advice

## 2024-07-26 ENCOUNTER — Ambulatory Visit: Payer: Self-pay | Admitting: Urology

## 2024-07-26 VITALS — BP 124/66 | HR 61 | Ht 70.0 in | Wt 226.0 lb

## 2024-07-26 DIAGNOSIS — N401 Enlarged prostate with lower urinary tract symptoms: Secondary | ICD-10-CM

## 2024-07-26 DIAGNOSIS — C61 Malignant neoplasm of prostate: Secondary | ICD-10-CM

## 2024-07-26 DIAGNOSIS — N138 Other obstructive and reflux uropathy: Secondary | ICD-10-CM

## 2024-07-29 ENCOUNTER — Encounter: Payer: Self-pay | Admitting: Urology

## 2024-08-06 ENCOUNTER — Ambulatory Visit (INDEPENDENT_AMBULATORY_CARE_PROVIDER_SITE_OTHER): Admitting: Family Medicine

## 2024-08-06 ENCOUNTER — Other Ambulatory Visit: Payer: Self-pay | Admitting: Family Medicine

## 2024-08-06 ENCOUNTER — Encounter: Payer: Self-pay | Admitting: Family Medicine

## 2024-08-06 VITALS — BP 118/70 | HR 54 | Resp 16 | Ht 70.0 in | Wt 223.2 lb

## 2024-08-06 DIAGNOSIS — I10 Essential (primary) hypertension: Secondary | ICD-10-CM

## 2024-08-06 MED ORDER — AMLODIPINE BESYLATE 5 MG PO TABS: 5.0000 mg | ORAL_TABLET | Freq: Every day | ORAL | 1 refills | Status: AC

## 2024-08-06 NOTE — Progress Notes (Signed)
 "     Established patient visit   Patient: Manuel Patel   DOB: 1951-03-18   73 y.o. Male  MRN: [id number] Visit Date: 08/06/2024  Today's healthcare provider: Jon Eva, MD   Chief Complaint  Patient presents with   Hypertension    4 wk f/u- reports doing good with bp readings   Subjective    Hypertension   HPI     Hypertension    Additional comments: 4 wk f/u- reports doing good with bp readings      Last edited by Wilfred Hargis RAMAN, CMA on 08/06/2024  3:37 PM.       Discussed the use of AI scribe software for clinical note transcription with the patient, who gave verbal consent to proceed.  History of Present Illness   Manuel Patel is a 73 year old male with hypertension who presents for a follow-up on blood pressure management.  He was last seen on July 20, 2024, with blood pressure 158/90. His current medications are olmesartan  40 mg twice daily, atenolol  50 mg twice daily, furosemide 20 mg daily, and amlodipine  5 mg daily started after that visit. Recent home and [facility] blood pressures are around 124/66. He previously had significant ankle swelling on amlodipine  that led to stopping it, but he has had no swelling on the current regimen. He has no dizziness or other symptoms of low blood pressure.  He developed new visual floaters over the past weekend starting on Christmas Eve. They were initially intermittent and became persistent by Saturday, described as gnats flying in front of his eyes. He was evaluated at [facility] with dilated exam and eye imaging. His vision is currently fuzzy, and he is avoiding driving.  He has early heart disease and is scheduled to see a cardiologist tomorrow.       Medications: Show/hide medication list[1]  Review of Systems     Objective    BP 118/70 (BP Location: Left Arm, Patient Position: Sitting, Cuff Size: Normal)   Pulse (!) 54   Resp 16   Ht 5' 10 (1.778 m)   Wt 223 lb  3.2 oz (101.2 kg)   SpO2 99%   BMI 32.03 kg/m    Physical Exam Vitals reviewed.  Constitutional:      General: He is not in acute distress.    Appearance: Normal appearance. He is not diaphoretic.  HENT:     Head: Normocephalic and atraumatic.  Eyes:     General: No scleral icterus.    Conjunctiva/sclera: Conjunctivae normal.  Cardiovascular:     Rate and Rhythm: Normal rate and regular rhythm.     Heart sounds: Normal heart sounds. No murmur heard. Pulmonary:     Effort: Pulmonary effort is normal. No respiratory distress.     Breath sounds: Normal breath sounds. No wheezing or rhonchi.  Musculoskeletal:     Cervical back: Neck supple.     Right lower leg: No edema.     Left lower leg: No edema.  Lymphadenopathy:     Cervical: No cervical adenopathy.  Skin:    General: Skin is warm and dry.     Findings: No rash.  Neurological:     Mental Status: He is alert and oriented to person, place, and time. Mental status is at baseline.  Psychiatric:        Mood and Affect: Mood normal.        Behavior: Behavior normal.      No results  found for any visits on 08/06/24.  Assessment & Plan     Problem List Items Addressed This Visit       Cardiovascular and Mediastinum   Essential hypertension - Primary   Blood pressure is well-controlled with current medication regimen. Previous elevated reading of 158/90 mmHg has improved to 118/70 mmHg. No symptoms of dizziness or hypotension. Amlodipine  5 mg once daily was added recently, and there is a history of ankle swelling with this medication, but no current swelling. He is aware of the potential side effect and the plan to switch if swelling occurs. - Continue olmesartan  40 mg twice daily, atenolol  50 mg twice daily, furosemide 20 mg daily, and amlodipine  5 mg once daily. - Monitor for ankle swelling; if swelling occurs, will consider switching medication. - Ordered 90-day supply of amlodipine . - Scheduled follow-up appointment  in 5 months.       Relevant Medications   amLODipine  (NORVASC ) 5 MG tablet     Return in about 5 months (around 01/04/2025) for chronic disease f/u.       Jon Eva, MD  [facility] [phone] (phone) [phone] (fax)  [facility]     [1]  Outpatient Medications Prior to Visit  Medication Sig   atenolol  (TENORMIN ) 50 MG tablet Take 1 tablet (50 mg total) by mouth 2 (two) times daily.   fluticasone  (FLONASE ) 50 MCG/ACT nasal spray Place 1 spray into both nostrils daily as needed for allergies or rhinitis.   furosemide (LASIX) 20 MG tablet Take 20 mg by mouth daily.   Multiple Vitamin (MULTIVITAMIN) tablet Take 1 tablet by mouth 3 (three) times daily.   olmesartan  (BENICAR ) 40 MG tablet Take 1 tablet (40 mg total) by mouth 2 (two) times daily.   oxyCODONE  (OXY IR/ROXICODONE ) 5 MG immediate release tablet Take 1 tablet (5 mg total) by mouth every 4 (four) hours as needed for moderate pain (pain score 4-6).   tamsulosin  (FLOMAX ) 0.4 MG CAPS capsule TAKE 1 CAPSULE BY MOUTH ONCE DAILY   timolol  (TIMOPTIC -XR) 0.5 % ophthalmic gel-forming Place 1 drop into both eyes at bedtime.   [DISCONTINUED] amLODipine  (NORVASC ) 5 MG tablet Take 1 tablet (5 mg total) by mouth daily.   No facility-administered medications prior to visit.   "

## 2024-08-06 NOTE — Assessment & Plan Note (Signed)
 Blood pressure is well-controlled with current medication regimen. Previous elevated reading of 158/90 mmHg has improved to 118/70 mmHg. No symptoms of dizziness or hypotension. Amlodipine  5 mg once daily was added recently, and there is a history of ankle swelling with this medication, but no current swelling. He is aware of the potential side effect and the plan to switch if swelling occurs. - Continue olmesartan  40 mg twice daily, atenolol  50 mg twice daily, furosemide 20 mg daily, and amlodipine  5 mg once daily. - Monitor for ankle swelling; if swelling occurs, will consider switching medication. - Ordered 90-day supply of amlodipine . - Scheduled follow-up appointment in 5 months.

## 2024-08-11 ENCOUNTER — Other Ambulatory Visit: Payer: Self-pay | Admitting: Urology

## 2024-08-11 DIAGNOSIS — C61 Malignant neoplasm of prostate: Secondary | ICD-10-CM

## 2025-01-07 ENCOUNTER — Ambulatory Visit: Admitting: Family Medicine

## 2025-01-17 ENCOUNTER — Ambulatory Visit: Admitting: Urology
